# Patient Record
Sex: Female | Born: 1935 | Race: White | Hispanic: No | Smoking: Never smoker
Health system: Southern US, Community
[De-identification: ages and names within clinical notes are randomized; demographics above are authoritative.]

## PROBLEM LIST (undated history)

## (undated) DIAGNOSIS — C679 Malignant neoplasm of bladder, unspecified: Secondary | ICD-10-CM

## (undated) DIAGNOSIS — G309 Alzheimer's disease, unspecified: Secondary | ICD-10-CM

## (undated) DIAGNOSIS — K219 Gastro-esophageal reflux disease without esophagitis: Secondary | ICD-10-CM

## (undated) DIAGNOSIS — K5900 Constipation, unspecified: Secondary | ICD-10-CM

## (undated) DIAGNOSIS — S32000A Wedge compression fracture of unspecified lumbar vertebra, initial encounter for closed fracture: Secondary | ICD-10-CM

## (undated) DIAGNOSIS — I2510 Atherosclerotic heart disease of native coronary artery without angina pectoris: Secondary | ICD-10-CM

## (undated) DIAGNOSIS — T4145XA Adverse effect of unspecified anesthetic, initial encounter: Secondary | ICD-10-CM

## (undated) DIAGNOSIS — E039 Hypothyroidism, unspecified: Secondary | ICD-10-CM

## (undated) DIAGNOSIS — F0280 Dementia in other diseases classified elsewhere without behavioral disturbance: Secondary | ICD-10-CM

## (undated) DIAGNOSIS — E785 Hyperlipidemia, unspecified: Secondary | ICD-10-CM

## (undated) DIAGNOSIS — I10 Essential (primary) hypertension: Secondary | ICD-10-CM

## (undated) DIAGNOSIS — S42309A Unspecified fracture of shaft of humerus, unspecified arm, initial encounter for closed fracture: Secondary | ICD-10-CM

## (undated) DIAGNOSIS — M1990 Unspecified osteoarthritis, unspecified site: Secondary | ICD-10-CM

## (undated) DIAGNOSIS — Z8719 Personal history of other diseases of the digestive system: Secondary | ICD-10-CM

## (undated) DIAGNOSIS — Z8711 Personal history of peptic ulcer disease: Secondary | ICD-10-CM

## (undated) DIAGNOSIS — T8859XA Other complications of anesthesia, initial encounter: Secondary | ICD-10-CM

## (undated) DIAGNOSIS — S72002A Fracture of unspecified part of neck of left femur, initial encounter for closed fracture: Secondary | ICD-10-CM

## (undated) DIAGNOSIS — R112 Nausea with vomiting, unspecified: Secondary | ICD-10-CM

## (undated) DIAGNOSIS — Z9889 Other specified postprocedural states: Secondary | ICD-10-CM

## (undated) HISTORY — PX: ABDOMINAL HYSTERECTOMY: SHX81

## (undated) HISTORY — PX: CATARACT EXTRACTION, BILATERAL: SHX1313

## (undated) HISTORY — PX: CARPAL TUNNEL RELEASE: SHX101

---

## 1965-09-19 HISTORY — PX: CLOSED REDUCTION RADIAL HEAD / NECK FRACTURE: SUR238

## 1993-09-19 HISTORY — PX: CORONARY ARTERY BYPASS GRAFT: SHX141

## 1997-12-18 ENCOUNTER — Encounter: Admission: RE | Admit: 1997-12-18 | Discharge: 1998-03-18 | Payer: Self-pay | Admitting: Neurosurgery

## 1998-03-09 ENCOUNTER — Encounter: Admission: RE | Admit: 1998-03-09 | Discharge: 1998-06-07 | Payer: Self-pay | Admitting: Neurosurgery

## 1998-05-01 ENCOUNTER — Inpatient Hospital Stay (HOSPITAL_COMMUNITY): Admission: EM | Admit: 1998-05-01 | Discharge: 1998-05-11 | Payer: Self-pay | Admitting: Emergency Medicine

## 1999-04-06 ENCOUNTER — Ambulatory Visit (HOSPITAL_COMMUNITY): Admission: RE | Admit: 1999-04-06 | Discharge: 1999-04-06 | Payer: Self-pay | Admitting: Interventional Cardiology

## 1999-04-20 ENCOUNTER — Encounter: Payer: Self-pay | Admitting: Cardiothoracic Surgery

## 1999-04-20 ENCOUNTER — Inpatient Hospital Stay (HOSPITAL_COMMUNITY): Admission: RE | Admit: 1999-04-20 | Discharge: 1999-04-26 | Payer: Self-pay | Admitting: Cardiothoracic Surgery

## 1999-04-21 ENCOUNTER — Encounter: Payer: Self-pay | Admitting: Cardiothoracic Surgery

## 1999-04-22 ENCOUNTER — Encounter: Payer: Self-pay | Admitting: Cardiothoracic Surgery

## 1999-04-23 ENCOUNTER — Encounter: Payer: Self-pay | Admitting: Cardiothoracic Surgery

## 1999-05-18 ENCOUNTER — Encounter: Payer: Self-pay | Admitting: Cardiothoracic Surgery

## 1999-05-18 ENCOUNTER — Emergency Department (HOSPITAL_COMMUNITY): Admission: EM | Admit: 1999-05-18 | Discharge: 1999-05-18 | Payer: Self-pay | Admitting: Emergency Medicine

## 1999-06-01 ENCOUNTER — Encounter (HOSPITAL_COMMUNITY): Admission: RE | Admit: 1999-06-01 | Discharge: 1999-08-30 | Payer: Self-pay

## 1999-06-29 ENCOUNTER — Encounter: Admission: RE | Admit: 1999-06-29 | Discharge: 1999-06-29 | Payer: Self-pay | Admitting: Neurosurgery

## 1999-09-28 ENCOUNTER — Encounter: Payer: Self-pay | Admitting: Internal Medicine

## 1999-09-28 ENCOUNTER — Encounter: Admission: RE | Admit: 1999-09-28 | Discharge: 1999-09-28 | Payer: Self-pay | Admitting: Internal Medicine

## 2000-10-02 ENCOUNTER — Encounter: Admission: RE | Admit: 2000-10-02 | Discharge: 2000-10-02 | Payer: Self-pay | Admitting: Internal Medicine

## 2000-10-02 ENCOUNTER — Encounter: Payer: Self-pay | Admitting: Internal Medicine

## 2001-10-04 ENCOUNTER — Encounter: Payer: Self-pay | Admitting: Internal Medicine

## 2001-10-04 ENCOUNTER — Encounter: Admission: RE | Admit: 2001-10-04 | Discharge: 2001-10-04 | Payer: Self-pay | Admitting: Internal Medicine

## 2002-10-07 ENCOUNTER — Encounter: Payer: Self-pay | Admitting: Internal Medicine

## 2002-10-07 ENCOUNTER — Encounter: Admission: RE | Admit: 2002-10-07 | Discharge: 2002-10-07 | Payer: Self-pay | Admitting: Internal Medicine

## 2003-10-30 ENCOUNTER — Encounter: Admission: RE | Admit: 2003-10-30 | Discharge: 2003-10-30 | Payer: Self-pay | Admitting: Internal Medicine

## 2004-07-21 ENCOUNTER — Inpatient Hospital Stay (HOSPITAL_COMMUNITY): Admission: EM | Admit: 2004-07-21 | Discharge: 2004-07-22 | Payer: Self-pay | Admitting: Emergency Medicine

## 2004-10-20 HISTORY — PX: TOTAL HIP ARTHROPLASTY: SHX124

## 2004-11-08 ENCOUNTER — Inpatient Hospital Stay (HOSPITAL_COMMUNITY): Admission: RE | Admit: 2004-11-08 | Discharge: 2004-11-11 | Payer: Self-pay | Admitting: Orthopedic Surgery

## 2005-01-17 ENCOUNTER — Encounter: Admission: RE | Admit: 2005-01-17 | Discharge: 2005-01-17 | Payer: Self-pay | Admitting: Internal Medicine

## 2006-07-12 ENCOUNTER — Encounter: Admission: RE | Admit: 2006-07-12 | Discharge: 2006-07-12 | Payer: Self-pay | Admitting: Internal Medicine

## 2008-04-28 ENCOUNTER — Encounter: Admission: RE | Admit: 2008-04-28 | Discharge: 2008-04-28 | Payer: Self-pay | Admitting: Internal Medicine

## 2009-01-05 ENCOUNTER — Encounter: Admission: RE | Admit: 2009-01-05 | Discharge: 2009-01-05 | Payer: Self-pay | Admitting: Internal Medicine

## 2009-01-17 HISTORY — PX: CYSTOSCOPY: SUR368

## 2009-01-23 ENCOUNTER — Encounter: Admission: RE | Admit: 2009-01-23 | Discharge: 2009-01-23 | Payer: Self-pay | Admitting: Urology

## 2009-01-28 ENCOUNTER — Ambulatory Visit (HOSPITAL_BASED_OUTPATIENT_CLINIC_OR_DEPARTMENT_OTHER): Admission: RE | Admit: 2009-01-28 | Discharge: 2009-01-28 | Payer: Self-pay | Admitting: Urology

## 2009-01-28 ENCOUNTER — Encounter (INDEPENDENT_AMBULATORY_CARE_PROVIDER_SITE_OTHER): Payer: Self-pay | Admitting: Urology

## 2009-08-07 ENCOUNTER — Encounter: Admission: RE | Admit: 2009-08-07 | Discharge: 2009-08-07 | Payer: Self-pay | Admitting: Geriatric Medicine

## 2009-08-19 HISTORY — PX: TRANSURETHRAL RESECTION OF BLADDER TUMOR: SHX2575

## 2009-09-01 ENCOUNTER — Ambulatory Visit (HOSPITAL_BASED_OUTPATIENT_CLINIC_OR_DEPARTMENT_OTHER): Admission: RE | Admit: 2009-09-01 | Discharge: 2009-09-01 | Payer: Self-pay | Admitting: Urology

## 2010-05-22 ENCOUNTER — Emergency Department (HOSPITAL_COMMUNITY): Admission: EM | Admit: 2010-05-22 | Discharge: 2010-05-22 | Payer: Self-pay | Admitting: Family Medicine

## 2010-10-10 ENCOUNTER — Encounter: Payer: Self-pay | Admitting: Orthopedic Surgery

## 2010-11-12 ENCOUNTER — Emergency Department (HOSPITAL_COMMUNITY): Payer: Medicare Other

## 2010-11-12 ENCOUNTER — Encounter (HOSPITAL_COMMUNITY): Payer: Self-pay

## 2010-11-12 ENCOUNTER — Inpatient Hospital Stay (HOSPITAL_COMMUNITY)
Admission: EM | Admit: 2010-11-12 | Discharge: 2010-11-15 | DRG: 552 | Disposition: A | Discharge status: Home Health | Payer: Medicare Other | Attending: Internal Medicine | Admitting: Internal Medicine

## 2010-11-12 DIAGNOSIS — S32009A Unspecified fracture of unspecified lumbar vertebra, initial encounter for closed fracture: Principal | ICD-10-CM | POA: Diagnosis present

## 2010-11-12 DIAGNOSIS — E039 Hypothyroidism, unspecified: Secondary | ICD-10-CM | POA: Diagnosis present

## 2010-11-12 DIAGNOSIS — W06XXXA Fall from bed, initial encounter: Secondary | ICD-10-CM | POA: Diagnosis present

## 2010-11-12 DIAGNOSIS — S0100XA Unspecified open wound of scalp, initial encounter: Secondary | ICD-10-CM | POA: Diagnosis present

## 2010-11-12 DIAGNOSIS — D510 Vitamin B12 deficiency anemia due to intrinsic factor deficiency: Secondary | ICD-10-CM | POA: Diagnosis present

## 2010-11-12 DIAGNOSIS — Y92009 Unspecified place in unspecified non-institutional (private) residence as the place of occurrence of the external cause: Secondary | ICD-10-CM

## 2010-11-12 DIAGNOSIS — I10 Essential (primary) hypertension: Secondary | ICD-10-CM | POA: Diagnosis present

## 2010-11-12 DIAGNOSIS — E876 Hypokalemia: Secondary | ICD-10-CM | POA: Diagnosis present

## 2010-11-12 DIAGNOSIS — C679 Malignant neoplasm of bladder, unspecified: Secondary | ICD-10-CM | POA: Diagnosis present

## 2010-11-12 DIAGNOSIS — Z951 Presence of aortocoronary bypass graft: Secondary | ICD-10-CM

## 2010-11-12 DIAGNOSIS — F068 Other specified mental disorders due to known physiological condition: Secondary | ICD-10-CM | POA: Diagnosis present

## 2010-11-12 LAB — POCT I-STAT, CHEM 8
BUN: 10 mg/dL (ref 6–23)
Calcium, Ion: 1.09 mmol/L — ABNORMAL LOW (ref 1.12–1.32)
Chloride: 97 mEq/L (ref 96–112)
Creatinine, Ser: 0.9 mg/dL (ref 0.4–1.2)
Glucose, Bld: 168 mg/dL — ABNORMAL HIGH (ref 70–99)
HCT: 36 % (ref 36.0–46.0)
Hemoglobin: 12.2 g/dL (ref 12.0–15.0)
Potassium: 2.8 mEq/L — ABNORMAL LOW (ref 3.5–5.1)
Sodium: 133 mEq/L — ABNORMAL LOW (ref 135–145)
TCO2: 25 mmol/L (ref 0–100)

## 2010-11-12 LAB — CK TOTAL AND CKMB (NOT AT ARMC)
CK, MB: 3.9 ng/mL (ref 0.3–4.0)
Relative Index: 4.2 — ABNORMAL HIGH (ref 0.0–2.5)
Total CK: 120 U/L (ref 7–177)

## 2010-11-12 LAB — BASIC METABOLIC PANEL
CO2: 28 mEq/L (ref 19–32)
Calcium: 8.3 mg/dL — ABNORMAL LOW (ref 8.4–10.5)
Creatinine, Ser: 0.78 mg/dL (ref 0.4–1.2)
Glucose, Bld: 106 mg/dL — ABNORMAL HIGH (ref 70–99)
Sodium: 131 mEq/L — ABNORMAL LOW (ref 135–145)

## 2010-11-12 LAB — CBC
HCT: 35.1 % — ABNORMAL LOW (ref 36.0–46.0)
Hemoglobin: 12.2 g/dL (ref 12.0–15.0)
MCH: 32 pg (ref 26.0–34.0)
MCHC: 34.8 g/dL (ref 30.0–36.0)
MCV: 92.1 fL (ref 78.0–100.0)
Platelets: 234 10*3/uL (ref 150–400)
RBC: 3.81 MIL/uL — ABNORMAL LOW (ref 3.87–5.11)
RDW: 12.5 % (ref 11.5–15.5)
WBC: 15.2 10*3/uL — ABNORMAL HIGH (ref 4.0–10.5)

## 2010-11-12 LAB — URINALYSIS, ROUTINE W REFLEX MICROSCOPIC
Hgb urine dipstick: NEGATIVE
Protein, ur: NEGATIVE mg/dL
Urine Glucose, Fasting: NEGATIVE mg/dL
Urobilinogen, UA: 1 mg/dL (ref 0.0–1.0)
pH: 6 (ref 5.0–8.0)

## 2010-11-12 LAB — DIFFERENTIAL
Basophils Absolute: 0 10*3/uL (ref 0.0–0.1)
Eosinophils Relative: 2 % (ref 0–5)
Lymphocytes Relative: 15 % (ref 12–46)
Lymphs Abs: 2.3 10*3/uL (ref 0.7–4.0)
Monocytes Relative: 7 % (ref 3–12)
Neutro Abs: 11.5 10*3/uL — ABNORMAL HIGH (ref 1.7–7.7)
Neutrophils Relative %: 76 % (ref 43–77)

## 2010-11-12 LAB — TROPONIN I: Troponin I: 0.11 ng/mL — ABNORMAL HIGH (ref 0.00–0.06)

## 2010-11-13 LAB — URINE CULTURE
Colony Count: NO GROWTH
Culture: NO GROWTH

## 2010-11-14 LAB — BASIC METABOLIC PANEL
BUN: 6 mg/dL (ref 6–23)
Calcium: 9 mg/dL (ref 8.4–10.5)
Creatinine, Ser: 0.63 mg/dL (ref 0.4–1.2)
GFR calc non Af Amer: >60 mL/min (ref >60)
Potassium: 4.1 mEq/L (ref 3.5–5.1)

## 2010-11-20 NOTE — Discharge Summary (Signed)
  NAME:  Heather [name], TRAPANI NO.:  [id number]  MEDICAL RECORD NO.:  [id number]           PATIENT TYPE:  I  LOCATION:  1602                         FACILITY:  [facility]  PHYSICIAN:  Theressa Millard, M.D.    DATE OF BIRTH:  10-25-1918  DATE OF ADMISSION:  11/12/2010 DATE OF DISCHARGE:  11/15/2010                              DISCHARGE SUMMARY   ADMITTING DIAGNOSES: 1. Fall with compression fracture and head laceration. 2. 25% L2 compression fracture. 3. Superficial laceration of the scalp. 4. Dementia. 5. Hypertension. 6. Hypothyroidism. 7. Pernicious anemia. 8. Papillary bladder cancer.  The patient is a 77 year old white female who lives pretty independently but has family members checking on her regularly.  She suffered a fall and came to the emergency department with back pain and small laceration of the scalp.  HOSPITAL COURSE:  After the scalp laceration was sutured, she underwent further evaluation which included a CT scan of the head without contrast that did not show any fracture, nor any evidence of bleeding.  She had spine films done which revealed evidence of compression fractures.  So, a CT scan of the thoracic spine was done.  This showed an old L1 compression fracture and a new endplate compression fracture of L2.  She had a fair amount of pain on the first hospital day requiring intravenous pain medications.  About her second hospital day, she was only taking Tylenol, was able to ambulate with assistance of physical therapy for 115 feet and on the day of discharge was ambulatory and feeling much better.  In regard to her memory loss, she had a little bit of confusion but actually did pretty well.  She recognized me when I came in to see her for her visit on the day of discharge.  Her blood pressure was slightly high during the hospitalization and this will be followed up on as an outpatient.  She will come in 5 days to have her staples  removed.  DISCHARGE MEDICATIONS: 1. Acetaminophen 500 mg 1 to 2 capsules q.6 h p.r.n. 2. Calcium with vitamin D twice daily. 3. Vitamin B12 shot monthly. 4. Metoprolol tartrate 25 mg twice daily. 5. Ranitidine 150 mg twice daily. 6. Synthroid 25 mcg daily. 7. Zetia 10 mg daily. 8. Plavix 75 mg daily. 9. Hydrochlorothiazide 25 mg daily.  FOLLOWUP:  See above.  In addition, she has an appointment to see me in May, to keep that appointment.  I discussed this situation with her son at length.  He is agreeable to the plan, notes that she is getting a little worse in terms of memory loss and we discussed the fact that she would need placement at some point but not at the present time.     Theressa Millard, M.D.     JO/MEDQ  D:  11/15/2010  T:  11/15/2010  Job:  [id number]  Electronically Signed by Theressa Millard M.D. on 11/20/2010 05:02:13 PM

## 2010-12-03 NOTE — H&P (Signed)
NAME:  Heather, [name] NO.:  [id number]  MEDICAL RECORD NO.:  [id number]           PATIENT TYPE:  E  LOCATION:  WLED                         FACILITY:  [facility]  PHYSICIAN:  Houston Siren, MD           DATE OF BIRTH:  02/02/1919  DATE OF ADMISSION:  11/12/2010 DATE OF DISCHARGE:                             HISTORY & PHYSICAL   PRIMARY CARE PHYSICIAN:  Dr. Theressa Millard.  ADVANCE DIRECTIVE:  Full code.  REASON FOR ADMISSION:  Compressive fracture of the lumbar spine, unable to ambulate.  HISTORY OF PRESENT ILLNESS:  This is a 77 year old female with history of dementia living alone, history of coronary artery disease status post coronary artery bypass graft in 1999, history of diverticulosis, hypothyroidism, GERD, and prior cervical fusion reportedly fell out of her bed today and hurt her back.  She denied any loss of consciousness, but is complaining of severe low-back pain.  She has no chest pain or shortness of breath.  She denies any headache or any visual problems. Workup in the emergency room included comprehensive metabolic panel which showed potassium of 2.8, blood sugar of 168, and creatinine of 0.9.  She has a negative urinalysis and negative head CT except for except for right maxillary sinusitis.  She does have elevated white count of 15,000 and hemoglobin of 12.2.  Her thoracic spine shows remote L1 fracture.  Her chest x-ray is negative except for cardiomegaly.  Her pelvic x-ray shows status post right total hip arthroplasty.  Her pelvic CT showed an anterior superior endplate fracture of L2 with at least 20% loss of height and moderate central canal stenosis at L4-L5. Hospitalist was asked to admit the patient to treat her symptoms as she has severe back pain, lives alone and cannot ambulate.  PAST MEDICAL HISTORY: 1. Coronary artery disease. 2. Dementia. 3. Hypertension. 4. Hypothyroidism. 5. GERD. 6. Diverticulosis. 7. Coronary artery disease  status post bypass graft in 1999. 8. Cervical fusion.  SOCIAL HISTORY:  She lives alone.  Denies alcohol, tobacco, or drug use.  FAMILY HISTORY:  Noncontributory.  ALLERGIES:  No known drug allergies.  CURRENT MEDICATIONS: 1. Plavix 75 mg per day. 2. HCTZ 25 mg per day. 3. Metoprolol 25 mg b.i.d. 4. Synthroid 25 mcg per day. 5. Zetia 10 mg per day. 6. Zantac 75 mg per day.  REVIEW OF SYSTEMS:  Otherwise unremarkable.  PHYSICAL EXAMINATION:  VITAL SIGNS:  Shows blood pressure 180/100, pulse of 80, respiratory rate of 20, and temperature 97.9. GENERAL:  She is alert and answers questions.  Some answers appropriate. Other answers they note that she is confused. HEENT:  She missed the year and the month, but now she is in the hospital.  She can recall that she fell.  Her speech is fluent and she has facial symmetry.  Her tongue is midline.  Uvula elevated with phonation. NECK:  Supple.  There is no carotid bruit. CARDIAC EXAM:  Revealed S1 and S2 with some premature beats.  No murmur, rub, or gallop. LUNGS:  Clear with good inspiratory and expiratory efforts. ABDOMINAL EXAM:  Soft, nondistended, and nontender.  No palpable mass. EXTREMITIES:  Showed no edema.  No calf tenderness. SKIN:  Warm and dry. NEUROLOGICAL EXAM:  Unremarkable. PSYCHIATRIC EXAM:  Unremarkable.  OBJECTIVE FINDINGS:  Shows serum sodium 133, potassium 2.8, glucose 168, and creatinine 0.9.  Urinalysis is negative.  INR 1.12.  White count of 15.2000, hemoglobin 12.2, and platelet count 234,000.  Head CT showed right maxillary sinus.  The CT of her pelvic shows acute fracture with at least 25% loss of height of L2 and moderate central canal stenosis at the L4 and L5.  IMPRESSION:  This is a 77 year old female who lives alone, fell, and fractured her L2 vertebral column with at least 25% loss of height  needing pain control as she cannot ambulate due to the pain.  Consider orthopedic consult to see whether  or not vertebroplasty and kyphoplasty or similar procedure can be done for her pain, and if that is the case please note that she is on Plavix.  We will continue all her medications.  Suspect that she will need short-term rehab.  We will replete her potassium with 4 runs IV, along with some added to her IV fluids and p.o. as well daily.  She is a full code and will be admitted to [facility].  She is stable.     Houston Siren, MD     PL/MEDQ  D:  11/12/2010  T:  11/12/2010  Job:  [id number]  Electronically Signed by Houston Siren  on 12/03/2010 08:13:09 PM

## 2010-12-21 LAB — POCT I-STAT 4, (NA,K, GLUC, HGB,HCT)
HCT: 38 % (ref 36.0–46.0)
Sodium: 141 mEq/L (ref 135–145)

## 2010-12-28 LAB — BASIC METABOLIC PANEL
BUN: 15 mg/dL (ref 6–23)
CO2: 28 mEq/L (ref 19–32)
Chloride: 102 mEq/L (ref 96–112)
Glucose, Bld: 98 mg/dL (ref 70–99)
Potassium: 3.6 mEq/L (ref 3.5–5.1)

## 2011-02-01 NOTE — Op Note (Signed)
NAME:  Heather [name], Heather [name]                ACCOUNT NO.:  [id number]   MEDICAL RECORD NO.:  [id number]          PATIENT TYPE:  AMB   LOCATION:  NESC                         FACILITY:  [facility]   PHYSICIAN:  Maretta Bees. Vonita Moss, M.D.DATE OF BIRTH:  09-Jun-1919   DATE OF PROCEDURE:  01/28/2009  DATE OF DISCHARGE:                               OPERATIVE REPORT   PREOPERATIVE DIAGNOSIS:  Carcinoma of the bladder.   POSTOPERATIVE DIAGNOSIS:  Carcinoma of the bladder.   PROCEDURE:  Cystoscopy, cold cup bladder biopsy and bilateral retrograde  pyelograms with interpretation.   SURGEON:  Dr. Larey Dresser.   ANESTHESIA:  MAC.   INDICATIONS:  This 77 year old lady had 3-4 days of gross hematuria.  She was taking Plavix.  She underwent cystoscopy in the office and she  had a small papillary tumor on what was thought to be the bladder floor  with a tiny blood clot.  She is been off Plavix and cleared for surgery  by Dr. Katrinka Blazing and brought to the OR today.  I should also add that a  renal ultrasound on the outside was reported as normal.   PROCEDURE:  The patient brought to the operating room and placed in  lithotomy position.  External genitalia prepped and draped in usual  fashion.  She was cystoscoped.  There was no active bleeding.  There was  an erythematous area between the dome and the base where I believe this  tumor used to be.  It appears as if it has sloughed off as there was no  evident papillary tumor present today as there was on office cystoscopy.   Using the cystoscope to insert a cone-tipped ureteral catheter, I  performed bilateral retrograde pyelograms that showed delicate  nonobstructed ureters and pyelocaliceal systems with no filling defects.  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   I then used the cold cup bladder biopsy forceps to biopsy the  erythematous area in the lower part of the bladder dome.  The biopsy  sites were fulgurated with the Bugbee  electrode.  The bladder was  emptied, the scope removed.  The patient sent to recovery room in good  condition having tolerated the procedure well.  She had essentially no  blood loss.      Maretta Bees. Vonita Moss, M.D.  Electronically Signed    LJP/MEDQ  D:  01/28/2009  T:  01/28/2009  Job:  [id number]   cc:   Lyn Records, M.D.  Fax: [phone]

## 2011-02-04 NOTE — Discharge Summary (Signed)
NAME:  Heather [name], Heather [name] NO.:  [id number]   MEDICAL RECORD NO.:  [id number]          PATIENT TYPE:  INP   LOCATION:  2034                         FACILITY:  [facility]   PHYSICIAN:  Lyn Records, M.D.   DATE OF BIRTH:  Sep 17, 1919   DATE OF ADMISSION:  07/21/2004  DATE OF DISCHARGE:  07/22/2004                                 DISCHARGE SUMMARY   PRIMARY CARE PHYSICIAN:  Theressa Millard, M.D.   CHIEF COMPLAINT AND REASON FOR ADMISSION:  Ms. Heather [name] is an 77-year-  old female patient with history of CABG x3 in 1999, reported to the ER on  the day of admission with intermittent waxing and waning, nocturnal chest  pain which began worsening about one year prior.  She had been using  increasing amounts of sublingual nitroglycerin and had noticed increased  frequency and chest discomfort.  Her chest pain was only mildly responsive  to nitroglycerin. Therefore, the patient, because she began having  increasing chest pain, called EMS and presented to the ER.  She was treated  en route with O2, sublingual nitroglycerin and aspirin and chest pain had  become relieved by the time she arrived to the ER. Important to note that  before her CABG she had not had any chest pain complaint and her cardiac  equivalent symptoms were shortness of breath, fatigue, and episodic pale  discoloration.  It is also important to note that the patient has been  tolerating work out in her yard, raking leaves for the past several weeks  and over the past weekend and did not have any chest discomfort with this  significant exertion, although the patient feels that this exertion may be  the etiology of her chest pain; i.e. musculoskeletal etiology.  Please refer  to the initial dictated history and physical examination for further  specifics regarding this admission.   ADMISSION DIAGNOSES:  1.  Recurrent chest pain in a patient with known coronary artery disease      rule out unstable angina.  2.   Dyslipidemia.  3.  Osteoarthritis.   HOSPITAL COURSE:  RECURRENT CHEST PAIN:  The patient was admitted to the  telemetry unit.  Serial cardiac isoenzymes were checked and were negative.  Repeat EKG was done and showed no changes.  As a precaution, IV heparin was  initiated.  Plavix was continued and low dose aspirin was initiated in  conjunction with proton pump inhibitor since the patient has significant  issues with heartburn.  Her Toprol was also continued as well as  hydrochlorothiazide.  The patient underwent an adenosine Cardiolite study on  July 22, 2004, which did not demonstrate any ischemia.  The patient had  no further chest pain.  She was reevaluated by Dr. Katrinka Blazing post Cardiolite and  at this point he felt that again maybe her discomfort was more  musculoskeletal in nature especially since she was now having more focal  pain in the neck and arms as well as anterior chest.  He as suspicious for  cervical disc problem.  At this point, she was cleared for discharge  home.   FINAL DIAGNOSES:  1.  Noncardiac chest discomfort.  2.  Neck and arm discomfort with associated chest discomfort.  Rule out      musculoskeletal versus other cervical disc type disorder.  3.  Hypertension, controlled.  4.  Dyslipidemia.   DISCHARGE MEDICATIONS:  1.  Toprol XL 25 daily.  2.  Zetia 10 mg daily.  3.  Plavix 75 mg daily.  4.  Hydrochlorothiazide 25 mg daily.  5.  Nitroglycerin 0.4 under the tongue p.r.n. pain.  6.  Vitamin D and vitamin E, calcium, Miacalcin as previous.   ACTIVITY:  As tolerated.   DIET:  Low fat, low salt.   FOLLOW UP:  1.  She needs to schedule an appointment with Dr. Earl Gala to have the neck      and arm and chest pain reevaluated from an internal medicine      prospective.  2.  She is to call Dr. Michaelle Copas office as needed, otherwise keep any annual      or semiannual appointments previously scheduled.       ALE/MEDQ  D:  08/19/2004  T:  08/19/2004  Job:   [id number]   cc:   Theressa Millard, M.D.  [address] [place]  [place] [address]  Fax: [phone]

## 2011-02-04 NOTE — H&P (Signed)
NAME:  Heather [name], KOCH NO.:  [id number]   MEDICAL RECORD NO.:  [id number]          PATIENT TYPE:  EMS   LOCATION:  MAJO                         FACILITY:  [facility]   PHYSICIAN:  Lyn Records, M.D.   DATE OF BIRTH:  1919/05/02   DATE OF ADMISSION:  07/21/2004  DATE OF DISCHARGE:                                HISTORY & PHYSICAL   CHIEF COMPLAINT:  Recurrent chest pain.   HISTORY OF PRESENT ILLNESS:  Heather [name] is an 77 year old female patient of  Dr. Michaelle Copas with history of CABG x3 in 1999.  I am unable to locate  operative reports at this time to clarify which vessels.  She presents here  today in the ER relating a history of intermittent waxing and waning,  nocturnal chest pain that started up about one year ago.  The patient began  using increasing amounts of sublingual nitroglycerin because of this.  Over  the past several weeks, she has had more frequent episodes of this chest  discomfort but not very severe.  Therefore, the patient was able to drift  off to sleep after taking more nitroglycerin and prior to this full relief  of chest pain.  Last night she had significant severe chest pain that  awakened her from sleep and instead of being waxing and waning in quality,  was continuous.  Chest pain persisted initially despite nitroglycerin and  since she was having symptoms this morning, she presented to the ER. By the  time she got to the ER she was having mild chest discomfort.  They gave her  sublingual nitroglycerin, O2 and aspirin and her chest pain is now relieved.  All episodes of chest pain have had no associated symptoms such as nausea,  diaphoresis, pale, no palpitations, etc.  The patient describes the pain as  being located in the left anterior chest radiating through to the back and  then down through the left arm and associated left hand numbness and  tingling.  In regard to previous cardiac symptoms before her CABG she had  not had any evidence of  chest pain but was complaining of shortness of  breath, fatigue and episodic pale discoloration.  Important to note that the  patient has been working out in her yard raking leaves for the past several  weeks and did this same type of physical activity over the weekend.  She has  not tried any analgesics for this nocturnal chest pressure but again relates  that it has been relieved by nitroglycerin.   ALLERGIES:  1.  CODEINE which causes nausea.  2.  ASPIRIN which causes heartburn, not a true allergy.   MEDICATIONS:  1.  Zetia 10 mg daily.  2.  Toprol XL, the patient is unclear about dose, once daily.  3.  Extra strength Tylenol at hour of sleep.  4.  Plavix 75 mg daily.  5.  Hydrochlorothiazide one whole 25 mg tablet daily.   REVIEW OF SYMPTOMS:  Essentially noncontributory.  No fevers, chills,  myalgias, no cough, no orthopnea, no dyspnea on exertion, no abdominal pain,  no  reflux, no palpitations, no dizziness, no syncope, no dark or bloody  stools, no leg pain other than chronic right hip pain for which she sees a  physician for, no lower extremity swelling.   FAMILY HISTORY:  She had three sisters all of whom died of myocardial  infarction related problems.  Father died of CVA, mother died of cerebral  hemorrhage.   SOCIAL HISTORY:  The patient has never utilized tobacco or alcohol products.  She still drives.  She lives alone.  Her family is nearby.   PAST MEDICAL HISTORY:  According to the patient:  1.  CABG x3 in 1999.  2.  Dyslipidemia.  3.  Osteoarthritis with continued problems with pain in the right hip.   PHYSICAL EXAMINATION:  GENERAL APPEARANCE:  A pleasant, well-developed,  elderly female.  Son is in room.  Appropriate to all questions asked.  Currently chest pain-free.  VITAL SIGNS:  Initial blood pressure was 161/74, pulse rate 68, after  treatment down to 100/71 with heart rate of 55, respiratory rate 20,  temperature 97.  HEENT:  Head is normocephalic.   No adenopathy.  Oral mucous membranes pink  and moist.  CHEST:  Bilateral lung sounds are clear to auscultation posteriorly.  Respiratory effort is nonlabored.  She is currently on nasal cannula O2.  Anterior chest wall is quite tender to palpation bilaterally.  CARDIOVASCULAR:  Heart sounds are S1 and S2, no JVD.  No carotid bruits.  Carotids are 2+ bilaterally.  __________ pulse is regular.  She is in sinus  rhythm, sinus bradycardia on the telemetry monitor.  ABDOMEN:  Soft, nontender, nondistended, no obvious hepatosplenomegaly,  masses or bruits.  EXTREMITIES:  Symmetrical __________, without clubbing, cyanosis, or edema.  Pulses are palpable at 2+ bilaterally radial, femoral and pedal.  NEUROLOGIC:  The patient is alert and oriented x3, moving all extremities  x4.  No focal deficits.   LABORATORY DATA:  Point of care enzymes:  1.  Myoglobin 152, MB 6.2,  troponin less than 0.05.  2. Myoglobin 121, MB 4.3, troponin less than 0.05.  BNP is 77.6.   Sodium 138, potassium 3.7, chloride 102, CO2 29, glucose 100, BUN 7,  creatinine 0.8.  LFT are within normal limits.  CBC shows a white count of  5700, hemoglobin 12.4, hematocrit 36.1, blood pressure 285,000.  PTT 33, PT  12.8, INR 1.0.   DIAGNOSTICS:  Chest x-ray was done in the ER, shows no acute process.   EKG done in the ER shows sinus rhythm, normal axis, normal R-wave rotation.  No acute ischemic changes.  This is actually improved as compared to the EKG  from 2003, which demonstrated some subtle T-wave inversion in leads III.   IMPRESSION:  1.  Recurrent chest pain in patient with known coronary artery disease      suspected unstable angina.  2.  Dyslipidemia.  3.  Osteoarthritis with chronic right hip pain.   PLAN:  1.  Dr. Katrinka Blazing will admit this patient to the telemetry unit.  2.  Will begin serial cardiac isoenzymes and repeat EKG in the morning. 3.  We will begin IV heparin and continue Plavix and add low dose aspirin       with PPI since patient has significant heartburn.  4.  Will continue her Toprol at her home dose.  Will start at 25 mg daily      with hold parameters since her blood pressure is now 100 systolic.  We  will also continue her hydrochlorothiazide since sodium and potassium      are normal.  5.  Will pursue an adenosine Cardiolite study and once enzymes become      positive or based on patient's past cardiac history and symptoms that      are consistent with unstable angina, Dr. Katrinka Blazing may wish to defer      noninvasive testing and proceed directly with diagnostic coronary      angiography.  6.  Will continue her Zetia since LFTs are normal and check a fasting lipid      panel in the morning.  7.  Will continue her Extra Strength Tylenol at night for pain.       ALE/MEDQ  D:  07/21/2004  T:  07/21/2004  Job:  [id number]   cc:   Theressa Millard, M.D.  [address] [place]  [place] [address]  Fax: [phone]   Lyn Records III, M.D.  [address]  [place]  [place] [address]  Fax: [phone]

## 2011-02-04 NOTE — Discharge Summary (Signed)
NAMESHADOE, CRYAN NO.:  [id number]   MEDICAL RECORD NO.:  [id number]          PATIENT TYPE:  INP   LOCATION:  5018                         FACILITY:  [facility]   PHYSICIAN:  Mila Homer. Sherlean Foot, M.D. DATE OF BIRTH:  05-01-1919   DATE OF ADMISSION:  11/08/2004  DATE OF DISCHARGE:  11/11/2004                                 DISCHARGE SUMMARY   ADMISSION DIAGNOSIS:  1.  End stage osteoarthritis of the right hip.  2.  Arthritic changes bilateral knees.  3.  Coronary artery disease with coronary artery bypass grafting in 1999,      normal stress test November 2005.  4.  Diverticulosis.  5.  Hypothyroidism.  6.  Gastroesophageal reflux disease.  7.  Recent diagnosis of vitamin B12 anemia.   DISCHARGE DIAGNOSIS:  1.  Status post right total hip arthroplasty.  2.  Vitamin B12 anemia exacerbated by surgery requiring 2 units packed red      blood cells.  3.  Coronary artery disease with coronary artery bypass grafting in 1999,      normal stress test November 2005.  4.  Diverticulosis.  5.  Arthritis changes of the bilateral knees.  6.  Hypothyroidism.  7.  Gastroesophageal reflux disease.   HISTORY OF PRESENT ILLNESS:  Heather [name] is an 77 year old female with a  history of progressively worsening right hip pain in the last year.  The  patient states the pain in the right hip occurs with any attempts of range  of motion.  The patient has noticed loss of range of motion of the hip due  to pain and mechanical symptoms.  The pain is located over the lateral  aspect of the thigh down into the mid femur and also lower back.  She  describes an occasional shooting pain all the way down into the right knee.  The pain is described as a constant ache with occasional shooting pain.  She  reports being unable to stand for more than 30-40 minutes at a time due to  increased pain.  The patient does have chronic popping of the right hip.  She has significant waking pain.  She uses a  cane to ambulate.  X-rays of  the right hip show bone on bone.   ALLERGIES:  Codeine causes increased confusion.   CURRENT MEDICATIONS:  Synthroid 25 mcg p.o. daily, Toprol XL 25 mg p.o.  daily, Zetia 10 mg p.o. daily, Plavix 75 mg p.o. daily, hydrochlorothiazide  25 mg p.o. daily, Miacalcin nasal spray 1 spray in the ear daily, vitamin E  400 international units, vitamin D 400 mg p.o. daily, Zantac 150 mg p.o.  p.r.n.   SURGICAL PROCEDURE:  The patient was taken to the operating room on November 08, 2004, by Dr. Georgena Spurling, assisted by Arnoldo Morale, P.A.-C.  The  patient was placed under general anesthesia and underwent right total hip  arthroplasty.  The following components were used, 50 mm no hole femoral cup  with a 32 mm liner, 12 mm femoral stem with a 32 mm ball.  The patient  tolerated  the procedure well and returned to the recovery room in stable  condition.   CONSULTATIONS:  PT, OT, case management.   HOSPITAL COURSE:  On postop day one, the patient developed acute blood loss  anemia secondary to surgery, H&H 9.1 and 26.3, the patient was asymptomatic.  The patient was afebrile, vital signs stable.  The patient did have numbness  in the left hand probably secondary to positioning in the OR.  Postop day  two, the patient's H&H 7.9 and 23, the patient was transfused 2 units packed  red blood cells.  The patient, otherwise, was afebrile, vital signs stable.  Sensation in the left hand had returned to normal.  On postop day three, the  patient is afebrile, vital signs stable, H&H 10.8 and 29.6 status post 2  units packed red blood cells.  The patient was progressing well with  physical therapy and was discharged to home on postop day three in stable  condition.   CBC on admission all values were within normal limits.  Coags on admission  all values within normal limits.  Routine chemistries on admission revealed  sodium 132, potassium 3.7, chloride 94, bicarb 29, glucose  94, BUN 9,  creatinine 0.7.  Hepatic enzymes on admission were within normal limits.  Urine cultures obtained preop showed greater than 100,000, it was presumed  this was contaminate.  X-rays of the right hip, two views, obtained on  November 08, 2004, showed no apparent complications status pots recent right  total hip replacement.  On ECG dated November 02, 2004, normal sinus rhythm,  heart rate 64 beats per minute, PR interval 162 milliseconds, PRV axis 76,  24, 46.   DISCHARGE MEDICATIONS:  The patient is to resume home medications.  The  following meds were to be added, Lovenox 1 injection subcu daily x 11 days,  Percocet 5/325 1-2 tablets every 4-6 hours p.r.n. pain.   DISCHARGE INSTRUCTIONS:  Activities:  Weight bearing as tolerated with the  walker.  Home health PT/OT.  Diet:  No restrictions.  Wound care:  The  patient is to keep the wound clean and dry, may shower after two days if no  drainage.  The patient is to call the office for temperature greater than  101.5, any foul smelling drainage, or pain not controlled with pain  medications.  Follow up postop day 14 with Dr. Sherlean Foot in the office, the  patient is to call the office at [phone].   CONDITION ON DISCHARGE:  The patient was discharged to home in good, stable  condition.      GC/MEDQ  D:  01/26/2005  T:  01/26/2005  Job:  [id number]

## 2011-02-04 NOTE — H&P (Signed)
NAME:  Heather [name], SYVERSON                ACCOUNT NO.:  [id number]   MEDICAL RECORD NO.:  [id number]          PATIENT TYPE:  INP   LOCATION:                               FACILITY:  [facility]   PHYSICIAN:  Mila Homer. Sherlean Foot, M.D. DATE OF BIRTH:  19-Jul-1919   DATE OF ADMISSION:  11/08/2004  DATE OF DISCHARGE:                                HISTORY & PHYSICAL   CHIEF COMPLAINT:  Right hip pain.   HISTORY OF PRESENT ILLNESS:  The patient is an 77 year old white female with  a history of progressively worsening right hip pain over the last year.  She  states she has significant amount of pain with attempts at range of motion.  She has noted loss of range of motion of the hip due to pain and mechanical  symptoms.  The pain is located over the lateral aspect of the thigh down  into the mid femur and also up into the lower back.  She also notes it  occasionally will shoot all the way down into the knee.  She describes it as  a constant ache, but it does become a sharp shooting pain with awkward  motions.  She states she is unable to stand more than 30 to 40 minutes due  to increased pain.  She does have grinding, popping.  She does have  significant night pain.  She is currently using a cane to assist ambulation.  X-rays reveal bone-on-bone of the right hip.   ALLERGIES:  CODEINE causing increased confusion.   CURRENT MEDICATIONS:  1.  Synthroid 25 mcg p.o. daily.  2.  Toprol XL 25 mg p.o. daily.  3.  Zetia 10 mg p.o. daily.  4.  Plavix 75 mg p.o. daily.  5.  Hydrochlorothiazide 25 mg p.o. daily.  6.  Miacalcin nasal spray one spray q.naris daily.  7.  Vitamin E 400 International Units daily.  8.  Vitamin D 400 mg p.o. daily.  9.  Zantac 150 mg p.o. p.r.n.   PAST MEDICAL HISTORY:  1.  Coronary artery disease with CABG in 1999, recent stress test on      November 11 was negative for any disease.  2.  Diverticulosis.  3.  Hypothyroidism.  4.  GERD.  5.  Allergic rhinitis.   PAST SURGICAL  HISTORY:  1.  Lumpectomy left breast in 1947.  2.  Hysterectomy in 1957.  3.  Cervical fusion in 1971.  4.  CABG in 2000.  5.  The patient denies any complications of the above mentioned surgical      procedures.   SOCIAL HISTORY:  The patient is an 77 year old white female who denies any  history of smoking or alcohol use.  She is widowed and has one son who is  with her today.  She lives in a [place] house with one step to the main  entrance.  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   PRIMARY CARE PHYSICIAN:  Theressa Millard, M.D., [phone].   CARDIOLOGIST:  Lyn Records, M.D.   FAMILY HISTORY:  Mother is deceased from a stroke as  was her father.  She  has no brothers or sisters.   REVIEW OF SYSTEMS:  Positive for full upper dentures, partial lower plate.  She does wear glasses for reading.  She has been recently diagnosed with  anemia, vitamin B12 deficiency, and she has started injections.  She has  occasional problems with pain on urination and more frequently at night and  we will be checking this with preadmission urinalysis.  Otherwise all other  review of systems categories are negative and noncontributory if not  mentioned above.   PHYSICAL EXAMINATION:  VITAL SIGNS:  Height 5 foot 2 inches, weight 128  pounds, blood pressure 124/60, heart rate 64, respirations 12, and the  patient is afebrile.  GENERAL:  This is a healthy, age-appropriate, elderly white female who  ambulates very slow with deliberate steps.  She does not have a cane with  her today, but her son is assisting her in ambulation.  She easily gets on  and off the examination table without assistance.  HEENT:  Head was normocephalic.  Pupils equal, round, and reactive to light.  Extraocular movements are intact.  Sclerae is nonicteric.  External ears  were without deformities.  Gross hearing is intact. Oral buccal mucosa was  pink and moist.  NECK:  Supple, no palpable lymphadenopathy.  The patient had  significant  loss of range of motion.  She was only able to elevate it about 5 or 10  degrees above horizontal and rotate it right or left maybe 10 or 15 degrees  due to previous cervical surgery.  CHEST:  Lung sounds were clear and equal bilaterally, no wheezes, rales, or  rhonchi.  HEART:  Regular rate and rhythm with no murmurs, rubs, or gallops.  She had  a well-healed midline surgical incision.  ABDOMEN:  Soft and nontender.  Bowel sounds were normal active.  She had no  CVA region tenderness.  EXTREMITIES:  Upper extremities were symmetrically sized and shape.  She had  fairly good range of motion of her right and left shoulders.  Motor strength  is 5/5.  Lower extremities; right hip had full extension, she was only able  to flex up to about 80 degrees.  She had minimal internal and external  rotation limited by pain and mechanical blocking.  The left hip had full  extension and flexion easily up to 120 with 20 degrees of internal and  external rotation.  Both knees has very coarse crepitus on the patella with  a catching sensation with full extension, flexion back to 100 degrees.  She  was slightly sore along the mediolateral joint line.  She had no effusions.  The calves were soft and nontender.  The ankles were symmetrical with good  dorsoplantar flexion.  PERIPHEROVASCULAR:  Carotid pulses were 1+. She had no bruits.  Radial  pulses were 1+.  Dorsalis pedis pulses were 1+.  She had a lower extremity  venous stasis changes or pitting edema.  NEUROLOGY:  The patient was conscious, alert, and appropriate.  Easily  converses with the examiner.  Cranial nerves II-XII grossly intact.  She had  no gross neurologic defects noted.  BREASTS:  RECTAL:  GENITOURINARY:  Deferred at this time.   IMPRESSION:  1.  End-stage osteoarthritis right hip.  2.  Arthritic changes bilateral knees. 3.  Coronary artery disease with coronary artery bypass graft in 1999,      normal stress test in  November of 2005.  4.  Diverticulosis.  5.  Hypothyroidism.  6.  Gastroesophageal reflux disease.  7.  Recent diagnosis of vitamin B12 anemia.   PLAN:  The patient will undergo all routine labs and tests.  We will check  her urine for a possible urinary tract infection and place her on the  appropriate antibiotic if it is positive.  Otherwise, the patient will  undergo all other routine labs and tests prior to having a right total hip  arthroplasty by Dr. Sherlean Foot at [facility] on November 08, 2004.  The  patient recently has a copy of information from her primary care physician  indicating that she is low risk for this coming surgery with a negative  stress test in November of 2005.      ________________________________________  Jamelle Rushing, P.A.  ___________________________________________  Mila Homer. Sherlean Foot, M.D.    RWK/MEDQ  D:  11/02/2004  T:  11/02/2004  Job:  [id number]

## 2011-02-04 NOTE — Op Note (Signed)
NAMEVICKKI, IGOU NO.:  [id number]   MEDICAL RECORD NO.:  [id number]          PATIENT TYPE:  INP   LOCATION:  5018                         FACILITY:  [facility]   PHYSICIAN:  Mila Homer. Sherlean Foot, M.D. DATE OF BIRTH:  1919/09/09   DATE OF PROCEDURE:  11/08/2004  DATE OF DISCHARGE:                                 OPERATIVE REPORT   SURGEON:  Mila Homer. Sherlean Foot, M.D.   ASSISTANT:  Legrand Pitts. Duffy, P.A.   ANESTHESIA:  General.   PREOPERATIVE DIAGNOSIS:  Right hip osteoarthritis.   POSTOPERATIVE DIAGNOSIS:  Right hip osteoarthritis.   PROCEDURE:  Right total hip arthroplasty.   INDICATIONS FOR PROCEDURE:  The patient is an 77 year old white female with  failure of conservative measures for osteoarthritis of the hip.  Informed  consent was obtained.   DESCRIPTION OF PROCEDURE:  The patient was laid supine and administered  general anesthesia and Foley catheter placement.  She was then placed in the  left down right lateral decubitus position.  The right hip was then  sterilely prepped and draped in the usual sterile fashion.  An anterolateral  minimally invasive approach was performed.  A #10 blade was used to make a 5  cm incision.  Cautery dissection was used down to the fascia, and then the  fascia was split along the length of the incision.  The anterior leading  edge of the gluteus medius was identified, and this was retracted back with  the retractor over the top of the femoral neck, a second retractor exposing  the hip capsule under the inferior neck.  An anterior hip capsulectomy was  then performed.  At this point, the sagittal saw was used, and the template  to make our neck cut.  I also made a cut of the articular surface margin and  removed the intercalary segment in the femoral head.  This allowed me to not  dislocate the hip.  I then placed a retractor anterior and posterior to the  acetabulum.  I then removed the labrum circumferentially, and then  progressively reamed up to 48 mm, and then tamped in a 50 mm no hole, no cup  fiber mesh cup.  I then snapped in a real standard liner, accepting a 32 mm  head.  At this point, I then turned my attention to the femur.  I flexed the  leg off the bed with the knee in 90 degrees of flexion, delivering the femur  up into the wound.  I placed a Mueller retractor underneath the cut surface  of the neck.  At this point, I sequentially reamed up to 12 mm and then  broached up to the 12 broach.  I then trialed with a 0 ball.  It was  extremely stable.  I took an x-ray which showed good fit and fill and no  fractures.  At this point, I then dislocated the hip and removed the trial  components.  I then irrigated copiously and then tamped down a size 12 fully  porous coated stem and placed on a 32 mm ball into a  cleaned Morse taper.  I  then located the hip, took it through an aggressive range of motion.  It was  quite stable.  At this point, I then irrigated and closed with a running #1  Vicryl stitch, interrupted buried 0 Vicryls, subcuticular 2-0 Vicryls, and  skin staples.  I dressed with Xeroform dressing, sponges, and a sterile  Ioban drape.   COMPLICATIONS:  None.   DRAINS:  None.   ESTIMATED BLOOD LOSS:  300 mL.      SDL/MEDQ  D:  11/08/2004  T:  11/09/2004  Job:  [id number]

## 2011-06-13 ENCOUNTER — Inpatient Hospital Stay (HOSPITAL_COMMUNITY)
Admission: EM | Admit: 2011-06-13 | Discharge: 2011-06-16 | DRG: 392 | Disposition: A | Discharge status: Home / Self Care | Payer: Medicare Other | Source: Ambulatory Visit | Attending: Internal Medicine | Admitting: Internal Medicine

## 2011-06-13 DIAGNOSIS — Z79899 Other long term (current) drug therapy: Secondary | ICD-10-CM

## 2011-06-13 DIAGNOSIS — Z951 Presence of aortocoronary bypass graft: Secondary | ICD-10-CM

## 2011-06-13 DIAGNOSIS — E669 Obesity, unspecified: Secondary | ICD-10-CM | POA: Diagnosis present

## 2011-06-13 DIAGNOSIS — I10 Essential (primary) hypertension: Secondary | ICD-10-CM | POA: Diagnosis present

## 2011-06-13 DIAGNOSIS — Z7902 Long term (current) use of antithrombotics/antiplatelets: Secondary | ICD-10-CM

## 2011-06-13 DIAGNOSIS — K5732 Diverticulitis of large intestine without perforation or abscess without bleeding: Principal | ICD-10-CM | POA: Diagnosis present

## 2011-06-13 DIAGNOSIS — I2510 Atherosclerotic heart disease of native coronary artery without angina pectoris: Secondary | ICD-10-CM | POA: Diagnosis present

## 2011-06-13 DIAGNOSIS — Z8551 Personal history of malignant neoplasm of bladder: Secondary | ICD-10-CM

## 2011-06-13 DIAGNOSIS — F0390 Unspecified dementia without behavioral disturbance: Secondary | ICD-10-CM | POA: Diagnosis present

## 2011-06-13 LAB — URINALYSIS, ROUTINE W REFLEX MICROSCOPIC
Bilirubin Urine: NEGATIVE
Glucose, UA: NEGATIVE mg/dL
Hgb urine dipstick: NEGATIVE
Ketones, ur: NEGATIVE mg/dL
Nitrite: NEGATIVE
Protein, ur: NEGATIVE mg/dL
Specific Gravity, Urine: 1.019 (ref 1.005–1.030)
Urobilinogen, UA: 0.2 mg/dL (ref 0.0–1.0)
pH: 5.5 (ref 5.0–8.0)

## 2011-06-13 LAB — URINE MICROSCOPIC-ADD ON

## 2011-06-14 ENCOUNTER — Emergency Department (HOSPITAL_COMMUNITY): Payer: Medicare Other

## 2011-06-14 LAB — POCT I-STAT TROPONIN I: Troponin i, poc: 0.03 ng/mL (ref 0.00–0.08)

## 2011-06-14 LAB — DIFFERENTIAL
Basophils Absolute: 0 10*3/uL (ref 0.0–0.1)
Basophils Relative: 0 % (ref 0–1)
Eosinophils Absolute: 0 10*3/uL (ref 0.0–0.7)
Neutro Abs: 18.2 10*3/uL — ABNORMAL HIGH (ref 1.7–7.7)
Neutrophils Relative %: 89 % — ABNORMAL HIGH (ref 43–77)

## 2011-06-14 LAB — LACTIC ACID, PLASMA: Lactic Acid, Venous: 1.3 mmol/L (ref 0.5–2.2)

## 2011-06-14 LAB — COMPREHENSIVE METABOLIC PANEL
ALT: 10 U/L (ref 0–35)
AST: 18 U/L (ref 0–37)
CO2: 27 mEq/L (ref 19–32)
Chloride: 95 mEq/L — ABNORMAL LOW (ref 96–112)
Creatinine, Ser: 0.91 mg/dL (ref 0.50–1.10)
GFR calc Af Amer: >60 mL/min (ref >60)
GFR calc non Af Amer: 58 mL/min — ABNORMAL LOW (ref >60)
Glucose, Bld: 138 mg/dL — ABNORMAL HIGH (ref 70–99)
Total Bilirubin: 0.6 mg/dL (ref 0.3–1.2)

## 2011-06-14 LAB — CBC
Hemoglobin: 11.5 g/dL — ABNORMAL LOW (ref 12.0–15.0)
MCHC: 34.4 g/dL (ref 30.0–36.0)
Platelets: 230 10*3/uL (ref 150–400)
RBC: 3.59 MIL/uL — ABNORMAL LOW (ref 3.87–5.11)

## 2011-06-14 LAB — PROTIME-INR: INR: 1.13 (ref 0.00–1.49)

## 2011-06-14 MED ORDER — IOHEXOL 300 MG/ML  SOLN
80.0000 mL | Freq: Once | INTRAMUSCULAR | Status: AC | PRN
  Administered 2011-06-14: 80 mL via INTRAVENOUS

## 2011-06-16 DIAGNOSIS — M79609 Pain in unspecified limb: Secondary | ICD-10-CM

## 2011-06-16 DIAGNOSIS — M7989 Other specified soft tissue disorders: Secondary | ICD-10-CM

## 2011-06-16 LAB — CBC
HCT: 29.6 % — ABNORMAL LOW (ref 36.0–46.0)
Hemoglobin: 10.3 g/dL — ABNORMAL LOW (ref 12.0–15.0)
MCH: 32.6 pg (ref 26.0–34.0)
MCHC: 34.8 g/dL (ref 30.0–36.0)
MCV: 93.7 fL (ref 78.0–100.0)
Platelets: 205 10*3/uL (ref 150–400)
RBC: 3.16 MIL/uL — ABNORMAL LOW (ref 3.87–5.11)
RDW: 12.7 % (ref 11.5–15.5)
WBC: 7.1 10*3/uL (ref 4.0–10.5)

## 2011-06-20 LAB — CULTURE, BLOOD (ROUTINE X 2)
Culture  Setup Time: 201209250836
Culture: NO GROWTH

## 2011-06-23 NOTE — H&P (Signed)
NAME:  Heather [name], Heather [name] NO.:  [id number]  MEDICAL RECORD NO.:  [id number]  LOCATION:                                 FACILITY:  PHYSICIAN:  Vania Rea, M.D. DATE OF BIRTH:  [birth year]  DATE OF ADMISSION:  06/14/2011 DATE OF DISCHARGE:                             HISTORY & PHYSICAL   PRIMARY CARE PHYSICIAN:  Theressa Millard, MD  DICTATING PHYSICIAN:  Vania Rea, MD  CHIEF COMPLAINT:  Abdominal pain and fever.  HISTORY OF PRESENT ILLNESS:  This is a 77 year old Caucasian lady with a history of dementia, coronary artery disease, hypertension who spiked a temperature of 103 yesterday morning, was having lower abdominal pain, and was eventually brought to the emergency room by her family for consideration of urinary tract infection.  The patient was evaluated and no evidence of urinary tract infection was found.  The patient was found to have left lower quadrant abdominal tenderness and a CAT scan of the abdomen was suggestive of sigmoid inflammation. Hospitalist Service was called to assist with management of possible diverticulitis.  The patient has dementia and is not oriented to place nor the reason why she is here and is unable to contribute further to the history.  Her son who looks after her and grandchildren are present.  PAST MEDICAL HISTORY:  Hypertension, coronary artery disease, dementia, history of bladder cancer, pernicious anemia, compression fracture of the lumbar spine.  MEDICATIONS:  Plavix, metoprolol, HCTZ, calcium, monthly B12 shots, and acetaminophen.  ALLERGIES:  She has no known drug allergies but she is extremely intolerant of NARCOTICS, become extreme delirious and combative when these are administered.  She can only tolerate small doses of tramadol.  SOCIAL HISTORY:  Lives on the family's supervision.  FAMILY HISTORY:  Unable to obtain details due to the patient's mental status.  REVIEW OF SYSTEMS:  Other than noted  above unable to obtain.  PHYSICAL EXAMINATION:  GENERAL:  Mildly obese elderly Caucasian lady lying flat in the stretcher, cooperative with the exam.  She looks moderately dehydrated. VITALS:  Her temperature is 98.6, pulse 69, respirations 16, blood pressure 104/46.  She is saturating at 98% on 2 liters. HEENT:  Her pupils are round and equal.  Mucous membranes are pink and anicteric.  No cervical lymphadenopathy.  No thyromegaly or carotid bruit. NECK:  No jugular venous distention. CHEST:  Clear to auscultation bilaterally. CARDIOVASCULAR SYSTEM:  Regular rhythm, 2/6 systolic murmur. ABDOMEN:  Obese, soft.  She does not display any tenderness. EXTREMITIES:  She has large bruise of the left shin with a tearing of her skin, although the tear is not open and there is no weeping.  Other minor bruises on the right shin, arthritic deformities of the hands and knees.  There is no edema.  Dorsalis pedis pulses are 2+ bilaterally. CENTRAL NERVOUS SYSTEM:  Cranial nerves II-XII grossly intact.  She has no focal lateralizing signs.  The gait was not tested.  LABORATORY DATA:  Her white count is elevated at 20.5, hemoglobin 11.5, MCV 93, platelets 230,000, neutrophils 89.  She has 18.2% neutrophils. Her lactic acid was 1.3.  Sodium was 133, potassium 3.5, chloride 95, CO2 27, glucose  133, BUN 19, creatinine 0.9.  Her liver functions are unremarkable.  Her lipase was normal.  Urinalysis shows clear urine, small amount of leukocyte esterase, otherwise unremarkable.  Urine microscopy is unremarkable.  CT scan of the abdomen and pelvis with contrast shows diffuse diverticulosis along the sigmoid with possible adjacent soft tissue stranding not to characterize because of mental artifact, scattered diverticulosis throughout the remainder of the colon, calcification along the abdominal aorta and its branches, markedly worsened compression deformity of the L2 body, stable compression deformity of L1.   The gallbladder is mildly distended but otherwise unremarkable.  ASSESSMENT: 1. Probably acute diverticulitis. 2. Dehydration 3. L2 compression fracture progressive. 4. Dementia. 5. Coronary artery disease. 6. Hypertension. 7. Hyperglycemia.  PLAN:  We will admit this lady for empiric treatment of diverticulitis with parenteral antibiotics.  We will continue management of her chronic medical conditions but we will withhold diuretics and withhold antihypertensives for the time being since her blood pressure is borderline. We will give moderately vigorous hydration considering her age and the likely stiffness of the myocardium.  We will write only for tramadol for severe pain, otherwise Tylenol.  Care will be assumed by Dr. Theressa Millard within the next hour.  Other plans as per orders.     Vania Rea, M.D.     LC/MEDQ  D:  06/14/2011  T:  06/14/2011  Job:  [id number]  cc:   Theressa Millard, M.D.  Electronically Signed by Vania Rea M.D. on 06/23/2011 08:22:35 PM

## 2011-07-02 NOTE — Discharge Summary (Signed)
  NAME:  AMADI, YOSHINO NO.:  [id number]  MEDICAL RECORD NO.:  [id number]  LOCATION:                                 FACILITY:  PHYSICIAN:  Theressa Millard, M.D.    DATE OF BIRTH:  [birth year]  DATE OF ADMISSION:  06/14/2011 DATE OF DISCHARGE:  06/16/2011                              DISCHARGE SUMMARY   ADMITTING DIAGNOSIS:  Fever.  DISCHARGE DIAGNOSES: 1. Diverticulitis. 2. Left leg abrasion with tenderness, deep vein thrombosis, ruled out. 3. Alzheimer's disease. 4. Hypercholesterolemia. 5. Pernicious anemia. 6. Hypothyroidism. 7. Hypertension. 8. Coronary artery disease.  The patient is a 77 year old white female who lives at home with help of family.  She came in with fever.  HOSPITAL COURSE:  The patient was admitted after CT scan revealed evidence of diverticulitis in the left lower quadrant.  She was treated with IV antibiotics (Cipro and Flagyl).  She defervesced rapidly.  She was changed to p.o. antibiotics.  Her diet was advanced and she did extraordinarily well.  She did have some mild confusion in the hospital, but a family member is a hospital employee and was able to spend time with her and that was very effective in keeping her behavior appropriate.  She did have a left leg abrasion which was being treated with OpSite, but associate with this was some left lower calf tenderness.  Ultrasound was negative for DVT.  She is discharged in improved condition and will see me in followup on July 11, 2011.  MEDICATIONS: 1. Cipro 500 mg b.i.d. x7 days. 2. Flagyl 500 mg t.i.d. x7 days. 3. Probiotics which she will obtain from her pharmacy. 4. Calcium carbonate twice daily. 5. Hydrochlorothiazide 25 mg daily. 6. Metoprolol tartrate 25 mg one half tablet daily. 7. Plavix 75 mg daily. 8. Synthroid 25 mcg daily. 9. Tramadol 50 mg q.6 h. p.r.n. pain. 10.Vitamin B12 injections once every other month. 11.Vitamin C tablet daily. 12.Vitamin E  tablet daily. 13.Zetia 10 mg daily.  DIET:  Regular diet will be encouraged.  ACTIVITY:  As tolerated.     Theressa Millard, M.D.     JO/MEDQ  D:  06/24/2011  T:  06/24/2011  Job:  [id number]  Electronically Signed by Theressa Millard M.D. on 07/02/2011 08:47:39 PM

## 2011-11-05 ENCOUNTER — Emergency Department (HOSPITAL_COMMUNITY)
Admission: EM | Admit: 2011-11-05 | Discharge: 2011-11-05 | Disposition: A | Discharge status: Home / Self Care | Payer: Medicare Other | Attending: Emergency Medicine | Admitting: Emergency Medicine

## 2011-11-05 ENCOUNTER — Emergency Department (HOSPITAL_COMMUNITY): Payer: Medicare Other

## 2011-11-05 ENCOUNTER — Encounter (HOSPITAL_COMMUNITY): Payer: Self-pay | Admitting: *Deleted

## 2011-11-05 DIAGNOSIS — W010XXA Fall on same level from slipping, tripping and stumbling without subsequent striking against object, initial encounter: Secondary | ICD-10-CM | POA: Insufficient documentation

## 2011-11-05 DIAGNOSIS — S42213A Unspecified displaced fracture of surgical neck of unspecified humerus, initial encounter for closed fracture: Secondary | ICD-10-CM | POA: Insufficient documentation

## 2011-11-05 DIAGNOSIS — M25539 Pain in unspecified wrist: Secondary | ICD-10-CM | POA: Insufficient documentation

## 2011-11-05 DIAGNOSIS — Y9229 Other specified public building as the place of occurrence of the external cause: Secondary | ICD-10-CM | POA: Insufficient documentation

## 2011-11-05 DIAGNOSIS — M25529 Pain in unspecified elbow: Secondary | ICD-10-CM | POA: Insufficient documentation

## 2011-11-05 DIAGNOSIS — S42209A Unspecified fracture of upper end of unspecified humerus, initial encounter for closed fracture: Secondary | ICD-10-CM

## 2011-11-05 DIAGNOSIS — F0390 Unspecified dementia without behavioral disturbance: Secondary | ICD-10-CM | POA: Insufficient documentation

## 2011-11-05 MED ORDER — TRAMADOL HCL 50 MG PO TABS: 50.0000 mg | ORAL_TABLET | Freq: Four times a day (QID) | ORAL | Status: AC | PRN

## 2011-11-05 MED ORDER — HYDROCODONE-ACETAMINOPHEN 5-325 MG PO TABS
1.0000 | ORAL_TABLET | Freq: Once | ORAL | Status: AC
  Administered 2011-11-05: 1 via ORAL
  Filled 2011-11-05: qty 1

## 2011-11-05 NOTE — Discharge Instructions (Signed)
Shoulder Fracture You have a fractured humerus (bone in the upper arm) at the shoulder just below the ball of the shoulder joint. Most of the time the bones of a broken shoulder are in an acceptable position. Usually the injury can be treated with a shoulder immobilizer or sling and swath bandage. These devices support the arm and prevent any shoulder movement. If the bones are not in a good position, then surgery is sometimes needed. Shoulder fractures usually cause swelling, pain, and discoloration around the upper arm initially. They heal in 8-12 weeks with proper treatment. Rest in bed or a reclining chair as long as your shoulder is very painful. Sitting up generally results in less pain at the fracture site. Do not remove your shoulder bandage until your caregiver approves. You may apply ice packs over the shoulder for 20-30 minutes every 2 hours for the next 2-3 days to reduce the pain and swelling. Use your pain medicine as prescribed.  SEEK IMMEDIATE MEDICAL CARE IF:  You develop severe shoulder pain unrelieved by rest and taking pain medicine.   You have pain, numbness, tingling, or weakness in the hand or wrist.   You develop shortness of breath, chest pain, severe weakness, or fainting.   You have severe pain with motion of the fingers or wrist.  MAKE SURE YOU:   Understand these instructions.   Will watch your condition.   Will get help right away if you are not doing well or get worse.  Document Released: 10/13/2004 Document Revised: 05/18/2011 Document Reviewed: 12/24/2008 [facility] Patient Information 2012 [place], [place].

## 2011-11-05 NOTE — ED Provider Notes (Cosign Needed)
History     CSN: [id number]  Arrival date & time 11/05/11  1311   First MD Initiated Contact with Patient 11/05/11 1341      Chief Complaint  Patient presents with  . Shoulder Pain  . Fall    (Consider location/radiation/quality/duration/timing/severity/associated sxs/prior treatment) HPI Comments: Level 5 caveat due to dementia.  Pt had a mechanical fall while going into a restaurant with family.  Others tried to catch her fall but she still landed on bilateral outstretched arms.  She has pain and swelling noted to left shoulder.  She reports some discomfort in anterior chest area which is reproducible and not associated with SOB, diaphoresis.  Denies neck or back pain.  No abdominal pain or flank pain.  No head injury reported.    Patient is a 78 y.o. female presenting with shoulder pain and fall. The history is provided by the patient and a relative.  Shoulder Pain  Fall    Past Medical History  Diagnosis Date  . Dementia     History reviewed. No pertinent past surgical history.  History reviewed. No pertinent family history.  History  Substance Use Topics  . Smoking status: Not on file  . Smokeless tobacco: Not on file  . Alcohol Use:     OB History    Grav Para Term Preterm Abortions TAB SAB Ect Mult Living                  Review of Systems  Unable to perform ROS: Dementia  All other systems reviewed and are negative.    Allergies  Codeine  Home Medications   Current Outpatient Rx  Name Route Sig Dispense Refill  . CALCIUM-VITAMIN D-VITAMIN K [phone] MG-UNT-MCG PO CHEW Oral Chew by mouth.    . CLOPIDOGREL BISULFATE 75 MG PO TABS Oral Take 75 mg by mouth daily.    Marland Kitchen EZETIMIBE 10 MG PO TABS Oral Take 10 mg by mouth daily.    Marland Kitchen HYDROCHLOROTHIAZIDE 25 MG PO TABS Oral Take 25 mg by mouth daily.    Marland Kitchen METOPROLOL TARTRATE 25 MG PO TABS Oral Take 12.5 mg by mouth 2 (two) times daily.    Marland Kitchen RANITIDINE HCL 75 MG PO TABS Oral Take 75 mg by mouth at  bedtime.    Marland Kitchen VITAMIN E 400 UNITS PO CAPS Oral Take 400 Units by mouth daily.    . TRAMADOL HCL 50 MG PO TABS Oral Take 1 tablet (50 mg total) by mouth every 6 (six) hours as needed for pain. 20 tablet 0    BP 166/55  Pulse 62  Temp 98 F (36.7 C)  Resp 20  SpO2 100%  Physical Exam  Nursing note and vitals reviewed. Constitutional: She appears well-developed and well-nourished. No distress.  HENT:  Head: Normocephalic and atraumatic.  Eyes: Pupils are equal, round, and reactive to light.  Neck: Neck supple. No spinous process tenderness and no muscular tenderness present.  Cardiovascular: Normal rate and intact distal pulses.   Pulses:      Radial pulses are 2+ on the left side.  Pulmonary/Chest: Effort normal.  Abdominal: Soft. She exhibits no distension. There is no tenderness.  Musculoskeletal: She exhibits edema and tenderness.       Arms:      Mildly tender at left wrist and left elbow, difficult to distinguish from radiating pain from left shoulder  Neurological: She is alert.       Strong grip strength of left hand, intact dorsiflexion at wrist.  Skin: Skin is warm, dry and intact. No abrasion and no laceration noted.    ED Course  Procedures (including critical care time)  Labs Reviewed - No data to display Dg Elbow Complete Left  11/05/2011  *RADIOLOGY REPORT*  Clinical Data: Tripped and fell.  Pain.  LEFT ELBOW - COMPLETE 3+ VIEW  Comparison: Left wrist and left shoulder radiographs 11/05/2011.  Findings: Bones are osteopenic.  The elbow is located.  No evidence of joint effusion or acute fracture.  IMPRESSION: No acute bony abnormality.  Original Report Authenticated By: Britta Mccreedy, M.D.   Dg Wrist Complete Left  11/05/2011  *RADIOLOGY REPORT*  Clinical Data: Pain post fall.  LEFT WRIST - COMPLETE 3+ VIEW  Comparison: None.  Findings: Diffuse osteopenia.  Moderate degenerative changes at the first [facility] and STT articulations.  Carpal rows intact.  Negative for  fracture, dislocation, or other acute bony abnormality.  Normal alignment.  Vascular calcifications noted in the distal forearm.  IMPRESSION: 1.  Negative for fracture or other acute bony abnormality. 2.  Osteopenia and degenerative changes as above.  Original Report Authenticated By: Osa Craver, M.D.   Dg Shoulder Left  11/05/2011  *RADIOLOGY REPORT*  Clinical Data: Pain post fall.  LEFT SHOULDER - 2+ VIEW  Comparison: None.  Findings: There is a transverse comminuted fracture across the surgical neck of the left humerus.  Fracture involves the greater tuberosity as a separate fracture fragment. Major fracture fragments are mildly distracted and angulated.  There is a small free osteochondral body projecting above the humeral head.  Scapula appears intact.  There is no dislocation.  IMPRESSION:  1. Comminuted intra-articular fracture, surgical neck left humerus.  Original Report Authenticated By: Thora Lance III, M.D.     1. Fracture, humerus, proximal       MDM  I reviewed plain films.  Proximal humeral shaft fracture, mildly comminuted and displaced.  Intact distal neurovascular status.  Will discuss with ortho.        3:05 PM I spoke to Dr. Cleophas Dunker and described the patient's exam as well as the x-ray findings. He requested the patient be placed in a sling and that she can be followed up in the office early this week. He asked that the patient use ice packs but not directly to the skin. Also mild analgesic prescription Septra tramadol. I discussed this with the patient's family member who is in agreement.  Gavin Pound. Virginie Josten, MD 11/05/11 1506

## 2011-11-05 NOTE — ED Notes (Signed)
Pt in s/o fall, c/o pain to left shoulder, increased pain with movement, pt with history of dementia

## 2013-06-18 ENCOUNTER — Emergency Department (HOSPITAL_COMMUNITY)
Admission: EM | Admit: 2013-06-18 | Discharge: 2013-06-18 | Disposition: A | Discharge status: Home / Self Care | Payer: Medicare Other | Attending: Emergency Medicine | Admitting: Emergency Medicine

## 2013-06-18 DIAGNOSIS — Z7902 Long term (current) use of antithrombotics/antiplatelets: Secondary | ICD-10-CM | POA: Insufficient documentation

## 2013-06-18 DIAGNOSIS — F0390 Unspecified dementia without behavioral disturbance: Secondary | ICD-10-CM | POA: Insufficient documentation

## 2013-06-18 DIAGNOSIS — Z79899 Other long term (current) drug therapy: Secondary | ICD-10-CM | POA: Insufficient documentation

## 2013-06-18 NOTE — ED Provider Notes (Signed)
CSN: [id number]     Arrival date & time 06/18/13  2239 History   First MD Initiated Contact with Patient 06/18/13 2254     Chief Complaint  Patient presents with  . Missing Person    (Consider location/radiation/quality/duration/timing/severity/associated sxs/prior Treatment) HPI Patient presents after he found by the police wandering in the street.  She has no complaints.  She is now eating a cheeseburger, drinking a soda.  She is oriented only to self.     Past Medical History  Diagnosis Date  . Dementia    No past surgical history on file. No family history on file. History  Substance Use Topics  . Smoking status: Not on file  . Smokeless tobacco: Not on file  . Alcohol Use:    OB History   Grav Para Term Preterm Abortions TAB SAB Ect Mult Living                 Review of Systems  Unable to perform ROS: Dementia    Allergies  Codeine  Home Medications   Current Outpatient Rx  Name  Route  Sig  Dispense  Refill  . Calcium-Vitamin D-Vitamin K (CALCIUM + D) [phone] MG-UNT-MCG CHEW   Oral   Chew by mouth.         . clopidogrel (PLAVIX) 75 MG tablet   Oral   Take 75 mg by mouth daily.         Marland Kitchen ezetimibe (ZETIA) 10 MG tablet   Oral   Take 10 mg by mouth daily.         . hydrochlorothiazide (HYDRODIURIL) 25 MG tablet   Oral   Take 25 mg by mouth daily.         . metoprolol tartrate (LOPRESSOR) 25 MG tablet   Oral   Take 12.5 mg by mouth 2 (two) times daily.         . ranitidine (ZANTAC) 75 MG tablet   Oral   Take 75 mg by mouth at bedtime.         . vitamin E (VITAMIN E) 400 UNIT capsule   Oral   Take 400 Units by mouth daily.          BP 171/52  Pulse 62  Temp(Src) 98.9 F (37.2 C) (Oral)  Resp 20  SpO2 98% Physical Exam  Nursing note and vitals reviewed. Constitutional: She is oriented to person, place, and time. She appears well-developed and well-nourished. No distress.  HENT:  Head: Normocephalic and atraumatic.   Eyes: Conjunctivae and EOM are normal.  Pulmonary/Chest: No stridor. No respiratory distress.  Abdominal: She exhibits no distension.  Musculoskeletal: She exhibits no edema.  Neurological: She is alert and oriented to person, place, and time. No cranial nerve deficit.  Skin: Skin is warm and dry.  Psychiatric: Her mood appears not anxious. Cognition and memory are impaired.    ED Course  Procedures (including critical care time) Labs Review Labs Reviewed - No data to display Imaging Review No results found. After the initial evaluation the police located a family member for this patient.  Update: Family member has arrived.  She states that the patient is behaving in a typical fashion for her.  Patient has still no complaints. MDM   1. Dementia    This elderly female with dementia presents after being found outside by police.  Patient escaped her house, and upon arrival of a family member to confirm the patient has dementia, is acting at baseline.  Patient  was discharged into the company of her daughter.    Gerhard Munch, MD 06/18/13 2322

## 2013-06-18 NOTE — ED Notes (Signed)
Patient's granddaughter here to pick up patient. Patient and granddaughter left ER ambulatory.

## 2013-06-18 NOTE — ED Notes (Signed)
Per EMS, patient was found at the intersection of [address] walking. Patient was approached by neighbors and patient did not know where she was. Patient had no complaints and no injuries.

## 2013-10-01 ENCOUNTER — Emergency Department (HOSPITAL_COMMUNITY): Payer: Medicare Other

## 2013-10-01 ENCOUNTER — Emergency Department (HOSPITAL_COMMUNITY)
Admission: EM | Admit: 2013-10-01 | Discharge: 2013-10-01 | Disposition: A | Discharge status: Home / Self Care | Payer: Medicare Other | Attending: Emergency Medicine | Admitting: Emergency Medicine

## 2013-10-01 ENCOUNTER — Encounter (HOSPITAL_COMMUNITY): Payer: Self-pay | Admitting: Emergency Medicine

## 2013-10-01 DIAGNOSIS — F0390 Unspecified dementia without behavioral disturbance: Secondary | ICD-10-CM | POA: Insufficient documentation

## 2013-10-01 DIAGNOSIS — Y92009 Unspecified place in unspecified non-institutional (private) residence as the place of occurrence of the external cause: Secondary | ICD-10-CM | POA: Insufficient documentation

## 2013-10-01 DIAGNOSIS — S4980XA Other specified injuries of shoulder and upper arm, unspecified arm, initial encounter: Secondary | ICD-10-CM | POA: Insufficient documentation

## 2013-10-01 DIAGNOSIS — S0003XA Contusion of scalp, initial encounter: Secondary | ICD-10-CM | POA: Insufficient documentation

## 2013-10-01 DIAGNOSIS — Z7902 Long term (current) use of antithrombotics/antiplatelets: Secondary | ICD-10-CM | POA: Insufficient documentation

## 2013-10-01 DIAGNOSIS — S46909A Unspecified injury of unspecified muscle, fascia and tendon at shoulder and upper arm level, unspecified arm, initial encounter: Secondary | ICD-10-CM | POA: Insufficient documentation

## 2013-10-01 DIAGNOSIS — Y939 Activity, unspecified: Secondary | ICD-10-CM | POA: Insufficient documentation

## 2013-10-01 DIAGNOSIS — W19XXXA Unspecified fall, initial encounter: Secondary | ICD-10-CM | POA: Insufficient documentation

## 2013-10-01 DIAGNOSIS — S0083XA Contusion of other part of head, initial encounter: Secondary | ICD-10-CM

## 2013-10-01 DIAGNOSIS — Z8781 Personal history of (healed) traumatic fracture: Secondary | ICD-10-CM | POA: Insufficient documentation

## 2013-10-01 DIAGNOSIS — Z79899 Other long term (current) drug therapy: Secondary | ICD-10-CM | POA: Insufficient documentation

## 2013-10-01 DIAGNOSIS — Z9889 Other specified postprocedural states: Secondary | ICD-10-CM | POA: Insufficient documentation

## 2013-10-01 DIAGNOSIS — S1093XA Contusion of unspecified part of neck, initial encounter: Principal | ICD-10-CM

## 2013-10-01 HISTORY — DX: Unspecified fracture of shaft of humerus, unspecified arm, initial encounter for closed fracture: S42.309A

## 2013-10-01 HISTORY — DX: Wedge compression fracture of unspecified lumbar vertebra, initial encounter for closed fracture: S32.000A

## 2013-10-01 LAB — POCT I-STAT, CHEM 8
BUN: 22 mg/dL (ref 6–23)
CHLORIDE: 103 meq/L (ref 96–112)
Calcium, Ion: 1.23 mmol/L (ref 1.13–1.30)
Creatinine, Ser: 0.9 mg/dL (ref 0.50–1.10)
GLUCOSE: 109 mg/dL — AB (ref 70–99)
HCT: 37 % (ref 36.0–46.0)
Hemoglobin: 12.6 g/dL (ref 12.0–15.0)
POTASSIUM: 3.8 meq/L (ref 3.7–5.3)
Sodium: 142 mEq/L (ref 137–147)
TCO2: 27 mmol/L (ref 0–100)

## 2013-10-01 MED ORDER — ACETAMINOPHEN 500 MG PO TABS
1000.0000 mg | ORAL_TABLET | Freq: Once | ORAL | Status: AC
  Administered 2013-10-01: 1000 mg via ORAL
  Filled 2013-10-01: qty 2

## 2013-10-01 NOTE — ED Notes (Signed)
Performed mouth care. Old dark blood assessed from nose and mouth.

## 2013-10-01 NOTE — ED Notes (Signed)
80 yo Female c/o fall on right side early this morning. Son was present and states no LOC. HX of alzheimer's and dementia and currently on plavix. Vitals stable 140/90, HR 80s. Pain is upper body across shoulders and arms.

## 2013-10-01 NOTE — ED Provider Notes (Signed)
CSN: [id number]     Arrival date & time 10/01/13  0706 History   First MD Initiated Contact with Patient 10/01/13 [phone]     Chief Complaint  Patient presents with  . Fall   Level V caveat: Dementia  HPI As reported that the patient has a history of dementia and she paces the floor all night long.  She was found by family laying on her left side this morning in her bedroom.  Unknown time when she fell.  Patient with evidence of left-sided facial trauma.  Reports that she's also been complaining of pain in both of her shoulders.  Patient has a history of dementia and is on Plavix.  They state currently at baseline mental status.  No recent illness.  Her appetite has been good as of lately.  No reported vomiting or diarrhea   Past Medical History  Diagnosis Date  . Dementia   . Fracture closed, humerus   . Lumbar compression fracture    Past Surgical History  Procedure Laterality Date  . Cardiac surgery  1995  . Hip surgery      Replacement   History reviewed. No pertinent family history. History  Substance Use Topics  . Smoking status: Never Smoker   . Smokeless tobacco: Not on file  . Alcohol Use: No   OB History   Grav Para Term Preterm Abortions TAB SAB Ect Mult Living                 Review of Systems  Unable to perform ROS   Allergies  Codeine  Home Medications   Current Outpatient Rx  Name  Route  Sig  Dispense  Refill  . calcium-vitamin D (OSCAL WITH D) 500-200 MG-UNIT per tablet   Oral   Take 1 tablet by mouth 2 (two) times daily.         . clopidogrel (PLAVIX) 75 MG tablet   Oral   Take 75 mg by mouth daily.         . hydrochlorothiazide (HYDRODIURIL) 25 MG tablet   Oral   Take 25 mg by mouth daily.         . metoprolol tartrate (LOPRESSOR) 25 MG tablet   Oral   Take 12.5 mg by mouth 2 (two) times daily.         . ranitidine (ZANTAC) 75 MG tablet   Oral   Take 75 mg by mouth at bedtime.         . vitamin B-12 (CYANOCOBALAMIN) 1000 MCG  tablet   Oral   Take 1,000 mcg by mouth daily.         . vitamin E (VITAMIN E) 400 UNIT capsule   Oral   Take 400 Units by mouth daily.          BP 124/49  Pulse 79  Temp(Src) 98.2 F (36.8 C) (Oral)  Resp 16  SpO2 100% Physical Exam  Nursing note and vitals reviewed. Constitutional: She appears well-developed and well-nourished. No distress.  HENT:  Head: Normocephalic and atraumatic.  Left-sided facial swelling with tenderness around the left infraorbital rim as well as a left maxillary sinus.  No trismus or malocclusion.  Dentition is degenerative without acute abnormalities.  No active bleeding from her nose or mouth.  Posterior pharynx is patent.  Extraocular movements are intact  Eyes: EOM are normal.  Neck: Normal range of motion.  Cardiovascular: Normal rate, regular rhythm and normal heart sounds.   Pulmonary/Chest: Effort normal and breath  sounds normal. She exhibits no tenderness.  Abdominal: Soft. She exhibits no distension. There is no tenderness.  Musculoskeletal:  Pain with palpation and range of motion of her bilateral shoulders and tenderness of her bilateral proximal humerus is.  Note tenderness and normal range of motion bilateral wrists and elbows.  No tenderness along her bilateral forearms.  No obvious deformity of her arms.  Full range of motion of her bilateral ankles knees and hips.  Neurological: She is alert.  Follow simple commands  Skin: Skin is warm and dry.  Psychiatric: She has a normal mood and affect. Judgment normal.    ED Course  Procedures (including critical care time) Labs Review Labs Reviewed  POCT I-STAT, CHEM 8 - Abnormal; Notable for the following:    Glucose, Bld 109 (*)    All other components within normal limits   Imaging Review Ct Head Wo Contrast  10/01/2013   CLINICAL DATA:  Fall on right-side. Hematoma left peri orbital region. Alzheimer's. On Plavix.  EXAM: CT HEAD WITHOUT CONTRAST  CT MAXILLOFACIAL WITHOUT CONTRAST   CT CERVICAL SPINE WITHOUT CONTRAST  TECHNIQUE: Multidetector CT imaging of the head, cervical spine, and maxillofacial structures were performed using the standard protocol without intravenous contrast. Multiplanar CT image reconstructions of the cervical spine and maxillofacial structures were also generated.  COMPARISON:  11/12/2010 CT head and cervical spine.  FINDINGS: CT HEAD FINDINGS  No skull fracture or intracranial hemorrhage.  Prominent small vessel disease type changes without CT evidence of large acute infarct.  Global atrophy. Ventricular prominence may be related to atrophy rather than hydrocephalus. Subtle component of hydrocephalus would be difficult to completely exclude. Appearance unchanged.  No intracranial mass lesion noted on this unenhanced exam.  Vascular calcifications.  CT MAXILLOFACIAL FINDINGS  Inwardly displaced fracture of the anterior wall of the left maxillary sinus. Buckling of the posterior lateral wall of the left maxillary sinus. Fluid within the left maxillary sinus appears dense consistent with hemorrhage. No evidence of left orbital floor fracture.  Mild hematoma inferior to the left orbit and overlying the left cheek.  Orbital structures appear intact.  Degenerative changes left temporal mandibular joint.  CT CERVICAL SPINE FINDINGS  No cervical spine fracture.  No abnormal prevertebral soft tissue swelling.  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  Transverse ligament hypertrophy with mild spinal stenosis.  Minimal anterior slip C3 and C4 unchanged. If there is a high clinical suspicion of ligamentous injury, flexion and extension views or MR can be performed for further delineation. Cervical spondylotic changes most prominent C5-6 level. Various degrees of spinal stenosis and foraminal narrowing most prominent C3-4 through C5-6.  IMPRESSION: No skull fracture or intracranial hemorrhage.  Inwardly displaced fracture of the anterior wall of the left maxillary sinus. Buckling of the posterior  lateral wall of the left maxillary sinus. Fluid within the left maxillary sinus appears dense consistent with hemorrhage. No evidence of left orbital floor fracture. Mild hematoma inferior to the left orbit and overlying the left cheek.  No cervical spine fracture. Please see above for additional findings.   Electronically Signed   By: Chauncey Cruel M.D.   On: 10/01/2013 09:03   Ct Cervical Spine Wo Contrast  10/01/2013   CLINICAL DATA:  Fall on right-side. Hematoma left peri orbital region. Alzheimer's. On Plavix.  EXAM: CT HEAD WITHOUT CONTRAST  CT MAXILLOFACIAL WITHOUT CONTRAST  CT CERVICAL SPINE WITHOUT CONTRAST  TECHNIQUE: Multidetector CT imaging of the head, cervical spine, and maxillofacial structures were performed using the standard protocol  without intravenous contrast. Multiplanar CT image reconstructions of the cervical spine and maxillofacial structures were also generated.  COMPARISON:  11/12/2010 CT head and cervical spine.  FINDINGS: CT HEAD FINDINGS  No skull fracture or intracranial hemorrhage.  Prominent small vessel disease type changes without CT evidence of large acute infarct.  Global atrophy. Ventricular prominence may be related to atrophy rather than hydrocephalus. Subtle component of hydrocephalus would be difficult to completely exclude. Appearance unchanged.  No intracranial mass lesion noted on this unenhanced exam.  Vascular calcifications.  CT MAXILLOFACIAL FINDINGS  Inwardly displaced fracture of the anterior wall of the left maxillary sinus. Buckling of the posterior lateral wall of the left maxillary sinus. Fluid within the left maxillary sinus appears dense consistent with hemorrhage. No evidence of left orbital floor fracture.  Mild hematoma inferior to the left orbit and overlying the left cheek.  Orbital structures appear intact.  Degenerative changes left temporal mandibular joint.  CT CERVICAL SPINE FINDINGS  No cervical spine fracture.  No abnormal prevertebral soft  tissue swelling.  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  Transverse ligament hypertrophy with mild spinal stenosis.  Minimal anterior slip C3 and C4 unchanged. If there is a high clinical suspicion of ligamentous injury, flexion and extension views or MR can be performed for further delineation. Cervical spondylotic changes most prominent C5-6 level. Various degrees of spinal stenosis and foraminal narrowing most prominent C3-4 through C5-6.  IMPRESSION: No skull fracture or intracranial hemorrhage.  Inwardly displaced fracture of the anterior wall of the left maxillary sinus. Buckling of the posterior lateral wall of the left maxillary sinus. Fluid within the left maxillary sinus appears dense consistent with hemorrhage. No evidence of left orbital floor fracture. Mild hematoma inferior to the left orbit and overlying the left cheek.  No cervical spine fracture. Please see above for additional findings.   Electronically Signed   By: Chauncey Cruel M.D.   On: 10/01/2013 09:03   Dg Humerus Left  10/01/2013   CLINICAL DATA:  [place].  Pain.  EXAM: LEFT HUMERUS - 2+ VIEW  COMPARISON:  11/05/2011.  FINDINGS: Remote left humeral head/ neck fracture. Prominent left glenohumeral joint degenerative changes. Mild acromioclavicular joint degenerative changes.  No acute fracture. If there is elbow pain, cone-down elbow series recommended.  IMPRESSION: Remote left humeral head/ neck fracture. Prominent left glenohumeral joint degenerative changes. Mild acromioclavicular joint degenerative changes.  No acute fracture. If there is elbow pain, cone-down elbow series recommended.   Electronically Signed   By: Chauncey Cruel M.D.   On: 10/01/2013 08:28   Dg Humerus Right  10/01/2013   CLINICAL DATA:  Fall.  Pain.  EXAM: RIGHT HUMERUS - 2+ VIEW  COMPARISON:  11/10/2009.  FINDINGS: Marked right glenohumeral joint degenerative changes. Moderate acromioclavicular joint degenerative changes.  No fracture. If there is elbow pain, cone-down elbow series  recommended.  IMPRESSION: Marked right glenohumeral joint degenerative changes. Moderate acromioclavicular joint degenerative changes.  No fracture. If there is elbow pain, cone-down elbow series recommended.   Electronically Signed   By: Chauncey Cruel M.D.   On: 10/01/2013 08:30   Ct Maxillofacial Wo Cm  10/01/2013   CLINICAL DATA:  Fall on right-side. Hematoma left peri orbital region. Alzheimer's. On Plavix.  EXAM: CT HEAD WITHOUT CONTRAST  CT MAXILLOFACIAL WITHOUT CONTRAST  CT CERVICAL SPINE WITHOUT CONTRAST  TECHNIQUE: Multidetector CT imaging of the head, cervical spine, and maxillofacial structures were performed using the standard protocol without intravenous contrast. Multiplanar CT image reconstructions of the cervical spine  and maxillofacial structures were also generated.  COMPARISON:  11/12/2010 CT head and cervical spine.  FINDINGS: CT HEAD FINDINGS  No skull fracture or intracranial hemorrhage.  Prominent small vessel disease type changes without CT evidence of large acute infarct.  Global atrophy. Ventricular prominence may be related to atrophy rather than hydrocephalus. Subtle component of hydrocephalus would be difficult to completely exclude. Appearance unchanged.  No intracranial mass lesion noted on this unenhanced exam.  Vascular calcifications.  CT MAXILLOFACIAL FINDINGS  Inwardly displaced fracture of the anterior wall of the left maxillary sinus. Buckling of the posterior lateral wall of the left maxillary sinus. Fluid within the left maxillary sinus appears dense consistent with hemorrhage. No evidence of left orbital floor fracture.  Mild hematoma inferior to the left orbit and overlying the left cheek.  Orbital structures appear intact.  Degenerative changes left temporal mandibular joint.  CT CERVICAL SPINE FINDINGS  No cervical spine fracture.  No abnormal prevertebral soft tissue swelling.  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  Transverse ligament hypertrophy with mild spinal stenosis.  Minimal  anterior slip C3 and C4 unchanged. If there is a high clinical suspicion of ligamentous injury, flexion and extension views or MR can be performed for further delineation. Cervical spondylotic changes most prominent C5-6 level. Various degrees of spinal stenosis and foraminal narrowing most prominent C3-4 through C5-6.  IMPRESSION: No skull fracture or intracranial hemorrhage.  Inwardly displaced fracture of the anterior wall of the left maxillary sinus. Buckling of the posterior lateral wall of the left maxillary sinus. Fluid within the left maxillary sinus appears dense consistent with hemorrhage. No evidence of left orbital floor fracture. Mild hematoma inferior to the left orbit and overlying the left cheek.  No cervical spine fracture. Please see above for additional findings.   Electronically Signed   By: Chauncey Cruel M.D.   On: 10/01/2013 09:03  I personally reviewed the imaging tests through PACS system I reviewed available ER/hospitalization records through the EMR   EKG Interpretation   None       MDM   1. Fall   2. Facial contusion    Small anterior left maxillary sinus fracture.  No other fractures.  Patient baseline mental status.  Discharge home in good condition.  PCP followup.    Hoy Morn, MD 10/01/13 [phone]

## 2013-10-01 NOTE — ED Notes (Signed)
MD at bedside. 

## 2014-01-01 ENCOUNTER — Encounter (HOSPITAL_COMMUNITY): Payer: Self-pay | Admitting: Emergency Medicine

## 2014-01-01 ENCOUNTER — Emergency Department (HOSPITAL_COMMUNITY)
Admission: EM | Admit: 2014-01-01 | Discharge: 2014-01-02 | Disposition: A | Discharge status: Home / Self Care | Payer: Medicare Other | Attending: Emergency Medicine | Admitting: Emergency Medicine

## 2014-01-01 DIAGNOSIS — S51019A Laceration without foreign body of unspecified elbow, initial encounter: Secondary | ICD-10-CM

## 2014-01-01 DIAGNOSIS — Z9889 Other specified postprocedural states: Secondary | ICD-10-CM | POA: Insufficient documentation

## 2014-01-01 DIAGNOSIS — S79929A Unspecified injury of unspecified thigh, initial encounter: Secondary | ICD-10-CM

## 2014-01-01 DIAGNOSIS — F0390 Unspecified dementia without behavioral disturbance: Secondary | ICD-10-CM | POA: Insufficient documentation

## 2014-01-01 DIAGNOSIS — Y9210 Unspecified residential institution as the place of occurrence of the external cause: Secondary | ICD-10-CM | POA: Insufficient documentation

## 2014-01-01 DIAGNOSIS — S51009A Unspecified open wound of unspecified elbow, initial encounter: Secondary | ICD-10-CM | POA: Insufficient documentation

## 2014-01-01 DIAGNOSIS — R296 Repeated falls: Secondary | ICD-10-CM | POA: Insufficient documentation

## 2014-01-01 DIAGNOSIS — Z8781 Personal history of (healed) traumatic fracture: Secondary | ICD-10-CM | POA: Insufficient documentation

## 2014-01-01 DIAGNOSIS — R233 Spontaneous ecchymoses: Secondary | ICD-10-CM | POA: Insufficient documentation

## 2014-01-01 DIAGNOSIS — S79919A Unspecified injury of unspecified hip, initial encounter: Secondary | ICD-10-CM | POA: Insufficient documentation

## 2014-01-01 DIAGNOSIS — W19XXXA Unspecified fall, initial encounter: Secondary | ICD-10-CM

## 2014-01-01 DIAGNOSIS — Y9389 Activity, other specified: Secondary | ICD-10-CM | POA: Insufficient documentation

## 2014-01-01 NOTE — ED Notes (Addendum)
Pt arrives via EMS from [facility]. EMS called by facility, pt had unwitnessed ground level fall on carpet. No new deficits found. Pt does have alzheimers and suffers from chronic pain. Pt only new complaint in right elbow pain, skin tear noted to rt elbow. EMS states that pt was able to stand and walk to the stretcher. BP 140/70 HR 76. GCS 15. Family states that pt has been limping on her rt leg for a few days.

## 2014-01-01 NOTE — ED Notes (Signed)
Dr Dina Rich in room

## 2014-01-02 ENCOUNTER — Emergency Department (HOSPITAL_COMMUNITY): Payer: Medicare Other

## 2014-01-02 LAB — URINALYSIS, ROUTINE W REFLEX MICROSCOPIC
Bilirubin Urine: NEGATIVE
GLUCOSE, UA: NEGATIVE mg/dL
Hgb urine dipstick: NEGATIVE
Ketones, ur: NEGATIVE mg/dL
LEUKOCYTES UA: NEGATIVE
Nitrite: NEGATIVE
PH: 6.5 (ref 5.0–8.0)
Protein, ur: NEGATIVE mg/dL
SPECIFIC GRAVITY, URINE: 1.021 (ref 1.005–1.030)
Urobilinogen, UA: 0.2 mg/dL (ref 0.0–1.0)

## 2014-01-02 NOTE — Discharge Instructions (Signed)
Fall Prevention and Home Safety Falls cause injuries and can affect all age groups. It is possible to prevent falls.  HOW TO PREVENT FALLS  Wear shoes with rubber soles that do not have an opening for your toes.  Keep the inside and outside of your house well lit.  Use night lights throughout your home.  Remove clutter from floors.  Clean up floor spills.  Remove throw rugs or fasten them to the floor with carpet tape.  Do not place electrical cords across pathways.  Put grab bars by your tub, shower, and toilet. Do not use towel bars as grab bars.  Put handrails on both sides of the stairway. Fix loose handrails.  Do not climb on stools or stepladders, if possible.  Do not wax your floors.  Repair uneven or unsafe sidewalks, walkways, or stairs.  Keep items you use a lot within reach.  Be aware of pets.  Keep emergency numbers next to the telephone.  Put smoke detectors in your home and near bedrooms. Ask your doctor what other things you can do to prevent falls. Document Released: 07/02/2009 Document Revised: 03/06/2012 Document Reviewed: 12/06/2011 [facility] Patient Information 2014 [place], [place]. Skin Tear Care A skin tear is a wound in which the top layer of skin has peeled off. This is a common problem with aging because the skin becomes thinner and more fragile as a person gets older. In addition, some medicines, such as oral corticosteroids, can lead to skin thinning if taken for long periods of time.  A skin tear is often repaired with tape or skin adhesive strips. This keeps the skin that has been peeled off in contact with the healthier skin beneath. Depending on the location of the wound, a bandage (dressing) may be applied over the tape or skin adhesive strips. Sometimes, during the healing process, the skin turns black and dies. Even when this happens, the torn skin acts as a good dressing until the skin underneath gets healthier and repairs itself. HOME CARE  INSTRUCTIONS   Change dressings once per day or as directed by your caregiver.  Gently clean the skin tear and the area around the tear using saline solution or mild soap and water.  Do not rub the injured skin dry. Let the area air dry.  Apply petroleum jelly or an antibiotic cream or ointment to keep the tear moist. This will help the wound heal. Do not allow a scab to form.  If the dressing sticks before the next dressing change, moisten it with warm soapy water and gently remove it.  Protect the injured skin until it has healed.  Only take over-the-counter or prescription medicines as directed by your caregiver.  Take showers or baths using warm soapy water. Apply a new dressing after the shower or bath.  Keep all follow-up appointments as directed by your caregiver.  SEEK IMMEDIATE MEDICAL CARE IF:   You have redness, swelling, or increasing pain in the skin tear.  You havepus coming from the skin tear.  You have chills.  You have a red streak that goes away from the skin tear.  You have a bad smell coming from the tear or dressing.  You have a fever or persistent symptoms for more than 2 3 days.  You have a fever and your symptoms suddenly get worse. MAKE SURE YOU:  Understand these instructions.  Will watch this condition.  Will get help right away if your child is not doing well or gets worse. Document  Released: 05/31/2001 Document Revised: 05/30/2012 Document Reviewed: 03/19/2012 [facility] Patient Information 2014 [place], [place].

## 2014-01-02 NOTE — ED Notes (Signed)
Pt taken to CT.

## 2014-01-02 NOTE — ED Provider Notes (Signed)
CSN: [id number]     Arrival date & time 01/01/14  2326 History   First MD Initiated Contact with Patient 01/01/14 2337     Chief Complaint  Patient presents with  . Fall     (Consider location/radiation/quality/duration/timing/severity/associated sxs/prior Treatment) HPI  This is a 80 year old female with history of dementia who presents from her living facility following an unwitnessed fall. Patient is completely disoriented and is unable to contribute to history taking. Per EMS report,  patient had a unwitnessed ground-level fall onto carpet. She appears to be her baseline per report. She was noted to have a skin tear to the right elbow. Patient was able to stand and ambulate to the stretcher. Patient's family is at the bedside. She is DO NOT RESUSCITATE/DO NOT INTUBATE per her power of attorney.  They state the patient does appear to be her baseline.  Level V caveat for dementia  Past Medical History  Diagnosis Date  . Dementia   . Fracture closed, humerus   . Lumbar compression fracture    Past Surgical History  Procedure Laterality Date  . Cardiac surgery  1995  . Hip surgery      Replacement   No family history on file. History  Substance Use Topics  . Smoking status: Never Smoker   . Smokeless tobacco: Not on file  . Alcohol Use: No   OB History   Grav Para Term Preterm Abortions TAB SAB Ect Mult Living                 Review of Systems  Unable to perform ROS: Dementia      Allergies  Codeine  Home Medications   Prior to Admission medications   Medication Sig Start Date End Date Taking? Authorizing Provider  Calcium Carbonate-Vitamin D (CALCIUM 600+D) 600-200 MG-UNIT TABS Take 1 tablet by mouth 2 (two) times daily.   Yes Historical Provider, MD  hydrochlorothiazide (HYDRODIURIL) 25 MG tablet Take 25 mg by mouth daily.   Yes Historical Provider, MD  levothyroxine (SYNTHROID, LEVOTHROID) 25 MCG tablet Take 25 mcg by mouth daily before breakfast.   Yes  Historical Provider, MD  metoprolol tartrate (LOPRESSOR) 25 MG tablet Take 12.5 mg by mouth 2 (two) times daily.   Yes Historical Provider, MD  vitamin B-12 (CYANOCOBALAMIN) 1000 MCG tablet Take 1,000 mcg by mouth daily.   Yes Historical Provider, MD  vitamin E (VITAMIN E) 400 UNIT capsule Take 400 Units by mouth daily.   Yes Historical Provider, MD   BP 159/35  Pulse 66  Temp(Src) 97.8 F (36.6 C) (Oral)  Resp 22  SpO2 98% Physical Exam  Nursing note and vitals reviewed. Constitutional:  Elderly, no acute distress  HENT:  Head: Normocephalic and atraumatic.  Mouth/Throat: Oropharynx is clear and moist.  Eyes: EOM are normal. Pupils are equal, round, and reactive to light.  Neck: Normal range of motion. Neck supple.  No midline C-spine tenderness  Cardiovascular: Normal rate, regular rhythm and normal heart sounds.   No murmur heard. Pulmonary/Chest: Effort normal and breath sounds normal. No respiratory distress. She has no wheezes.  Abdominal: Soft. There is no tenderness.  Musculoskeletal: Normal range of motion.  Patient appears to be in pain with range of motion of the right hip  Neurological: She is alert.  Moves all 4 extremities  Skin: Skin is warm and dry.  Quarter-sized skin tear to the right elbow, ecchymosis noted to the bilateral shins which appear old  Psychiatric: She has a normal mood  and affect.    ED Course  Procedures (including critical care time) Labs Review Labs Reviewed  URINALYSIS, ROUTINE W REFLEX MICROSCOPIC    Imaging Review Dg Elbow Complete Right  01/02/2014   CLINICAL DATA:  Right elbow pain status post fall  EXAM: RIGHT ELBOW - COMPLETE 3+ VIEW  COMPARISON:  None.  FINDINGS: The lateral view it is limited secondary to the obliquity. There is no evidence of fracture, dislocation, or joint effusion. There is no evidence of arthropathy or other focal bone abnormality. There is a soft tissue laceration over the olecranon.  IMPRESSION: Limited  lateral view secondary to obliquity. Otherwise no acute osseous injury of the right elbow.   Electronically Signed   By: Kathreen Devoid   On: 01/02/2014 00:47   Dg Hip Complete Right  01/02/2014   CLINICAL DATA:  Right leg pain  EXAM: RIGHT HIP - COMPLETE 2+ VIEW  COMPARISON:  None.  FINDINGS: There is generalized osteopenia. There is a right hip arthroplasty without failure or complication. There is no acute fracture or dislocation. There is moderate osteoarthritis of the left hip. There are degenerative changes of the lower lumbar spine and bilateral SI joints.  IMPRESSION: Right total hip arthroplasty without failure or complication. No right hip fracture or dislocation.   Electronically Signed   By: Kathreen Devoid   On: 01/02/2014 00:46   Ct Head Wo Contrast  01/02/2014   CLINICAL DATA:  Unwitnessed fall, chronic pain, limping on right leg  EXAM: CT HEAD WITHOUT CONTRAST  CT CERVICAL SPINE WITHOUT CONTRAST  TECHNIQUE: Multidetector CT imaging of the head and cervical spine was performed following the standard protocol without intravenous contrast. Multiplanar CT image reconstructions of the cervical spine were also generated.  COMPARISON:  CT HEAD W/O CM dated 10/01/2013; CT C SPINE W/O CM dated 10/01/2013; CT MAXILLOFACIAL W/O CM dated 10/01/2013; CT HEAD W/O CM dated 11/12/2010; CT C SPINE W/O CM dated 11/12/2010  FINDINGS: CT HEAD FINDINGS  There is no evidence of mass effect, midline shift, or extra-axial fluid collections. There is no evidence of a space-occupying lesion or intracranial hemorrhage. There is no evidence of a cortical-based area of acute infarction. There is generalized cerebral atrophy. There is periventricular white matter low attenuation likely secondary to microangiopathy.  The ventricles and sulci are appropriate for the patient's age. The basal cisterns are patent.  Visualized portions of the orbits are unremarkable. Mild bilateral ethmoid sinus mucosal thickening. Cerebrovascular  atherosclerotic calcifications are noted.  The osseous structures are unremarkable.  CT CERVICAL SPINE FINDINGS  The alignment is anatomic. The vertebral body heights are maintained. There is no acute fracture. Minimal anterolisthesis of C4 on C5. The prevertebral soft tissues are normal. The intraspinal soft tissues are not fully imaged on this examination due to poor soft tissue contrast, but there is no gross soft tissue abnormality.  There is degenerative disc disease of the cervical spine most severe at C5-6 and C6-7. There are mild broad-based disc bulges at C5-6 and C6-7. There is a mild broad-based disc bulge at C3-4. There is bilateral facet arthropathy throughout the cervical spine. 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 There is bilateral foraminal stenosis at C3-4, C4-5, C5-6 and C6-7.  The visualized portions of the lung apices demonstrate no focal abnormality.There is bilateral carotid artery atherosclerosis.  IMPRESSION: 1. No acute intracranial pathology. 2. No acute osseous injury of the cervical spine.   Electronically Signed   By: Kathreen Devoid  On: 01/02/2014 00:35   Ct Cervical Spine Wo Contrast  01/02/2014   CLINICAL DATA:  Unwitnessed fall, chronic pain, limping on right leg  EXAM: CT HEAD WITHOUT CONTRAST  CT CERVICAL SPINE WITHOUT CONTRAST  TECHNIQUE: Multidetector CT imaging of the head and cervical spine was performed following the standard protocol without intravenous contrast. Multiplanar CT image reconstructions of the cervical spine were also generated.  COMPARISON:  CT HEAD W/O CM dated 10/01/2013; CT C SPINE W/O CM dated 10/01/2013; CT MAXILLOFACIAL W/O CM dated 10/01/2013; CT HEAD W/O CM dated 11/12/2010; CT C SPINE W/O CM dated 11/12/2010  FINDINGS: CT HEAD FINDINGS  There is no evidence of mass effect, midline shift, or extra-axial fluid collections. There is no evidence of a space-occupying lesion or intracranial hemorrhage. There is no evidence of a  cortical-based area of acute infarction. There is generalized cerebral atrophy. There is periventricular white matter low attenuation likely secondary to microangiopathy.  The ventricles and sulci are appropriate for the patient's age. The basal cisterns are patent.  Visualized portions of the orbits are unremarkable. Mild bilateral ethmoid sinus mucosal thickening. Cerebrovascular atherosclerotic calcifications are noted.  The osseous structures are unremarkable.  CT CERVICAL SPINE FINDINGS  The alignment is anatomic. The vertebral body heights are maintained. There is no acute fracture. Minimal anterolisthesis of C4 on C5. The prevertebral soft tissues are normal. The intraspinal soft tissues are not fully imaged on this examination due to poor soft tissue contrast, but there is no gross soft tissue abnormality.  There is degenerative disc disease of the cervical spine most severe at C5-6 and C6-7. There are mild broad-based disc bulges at C5-6 and C6-7. There is a mild broad-based disc bulge at C3-4. There is bilateral facet arthropathy throughout the cervical spine. 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 There is bilateral foraminal stenosis at C3-4, C4-5, C5-6 and C6-7.  The visualized portions of the lung apices demonstrate no focal abnormality.There is bilateral carotid artery atherosclerosis.  IMPRESSION: 1. No acute intracranial pathology. 2. No acute osseous injury of the cervical spine.   Electronically Signed   By: Kathreen Devoid   On: 01/02/2014 00:35     EKG Interpretation   Date/Time:  Wednesday January 01 2014 23:49:14 EDT Ventricular Rate:  63 PR Interval:  193 QRS Duration: 95 QT Interval:  410 QTC Calculation: 420 R Axis:   25 Text Interpretation:  Sinus rhythm Ventricular premature complex Low  voltage, precordial leads Abnormal R-wave progression, early transition No  significant change since last tracing Confirmed by HORTON  MD, Loma Sousa  ([id number]) on 01/02/2014  12:37:38 AM      MDM   Final diagnoses:  Fall  Skin tear of elbow without complication   Patient presents following an unwitnessed fall. Nontoxic and at her baseline per patient's family. Skin tear of the right elbow and pain with range of motion of the right hip. Urinalysis shows no evidence of UTI. EKG is nonischemic and without evidence of arrhythmia. Imaging is reassuring. Discussed with the family. Patient will be discharged back to her living facility.  After history, exam, and medical workup I feel the patient has been appropriately medically screened and is safe for discharge home. Pertinent diagnoses were discussed with the patient. Patient was given return precautions.   Merryl Hacker, MD 01/02/14 [id number]

## 2014-03-11 ENCOUNTER — Inpatient Hospital Stay (HOSPITAL_COMMUNITY): Payer: Medicare Other

## 2014-03-11 ENCOUNTER — Emergency Department (HOSPITAL_COMMUNITY): Payer: Medicare Other

## 2014-03-11 ENCOUNTER — Inpatient Hospital Stay (HOSPITAL_COMMUNITY)
Admission: EM | Admit: 2014-03-11 | Discharge: 2014-03-14 | DRG: 482 | Disposition: A | Discharge status: Skilled Nursing Facility | Payer: Medicare Other | Attending: Internal Medicine | Admitting: Internal Medicine

## 2014-03-11 ENCOUNTER — Encounter (HOSPITAL_COMMUNITY): Payer: Self-pay | Admitting: Emergency Medicine

## 2014-03-11 ENCOUNTER — Inpatient Hospital Stay (HOSPITAL_COMMUNITY): Payer: Medicare Other | Admitting: Anesthesiology

## 2014-03-11 ENCOUNTER — Encounter (HOSPITAL_COMMUNITY): Payer: Medicare Other | Admitting: Anesthesiology

## 2014-03-11 ENCOUNTER — Encounter (HOSPITAL_COMMUNITY)
Admission: EM | Disposition: A | Discharge status: Skilled Nursing Facility | Payer: Self-pay | Source: Home / Self Care | Attending: Internal Medicine

## 2014-03-11 DIAGNOSIS — Z888 Allergy status to other drugs, medicaments and biological substances status: Secondary | ICD-10-CM

## 2014-03-11 DIAGNOSIS — F0390 Unspecified dementia without behavioral disturbance: Secondary | ICD-10-CM | POA: Diagnosis present

## 2014-03-11 DIAGNOSIS — Z7901 Long term (current) use of anticoagulants: Secondary | ICD-10-CM

## 2014-03-11 DIAGNOSIS — W19XXXA Unspecified fall, initial encounter: Secondary | ICD-10-CM | POA: Diagnosis present

## 2014-03-11 DIAGNOSIS — D510 Vitamin B12 deficiency anemia due to intrinsic factor deficiency: Secondary | ICD-10-CM | POA: Diagnosis present

## 2014-03-11 DIAGNOSIS — R1310 Dysphagia, unspecified: Secondary | ICD-10-CM | POA: Diagnosis present

## 2014-03-11 DIAGNOSIS — E039 Hypothyroidism, unspecified: Secondary | ICD-10-CM | POA: Diagnosis present

## 2014-03-11 DIAGNOSIS — Z8249 Family history of ischemic heart disease and other diseases of the circulatory system: Secondary | ICD-10-CM

## 2014-03-11 DIAGNOSIS — Z96649 Presence of unspecified artificial hip joint: Secondary | ICD-10-CM

## 2014-03-11 DIAGNOSIS — S72142A Displaced intertrochanteric fracture of left femur, initial encounter for closed fracture: Secondary | ICD-10-CM

## 2014-03-11 DIAGNOSIS — K5900 Constipation, unspecified: Secondary | ICD-10-CM | POA: Diagnosis present

## 2014-03-11 DIAGNOSIS — Z951 Presence of aortocoronary bypass graft: Secondary | ICD-10-CM

## 2014-03-11 DIAGNOSIS — Z66 Do not resuscitate: Secondary | ICD-10-CM | POA: Diagnosis present

## 2014-03-11 DIAGNOSIS — I2510 Atherosclerotic heart disease of native coronary artery without angina pectoris: Secondary | ICD-10-CM

## 2014-03-11 DIAGNOSIS — E785 Hyperlipidemia, unspecified: Secondary | ICD-10-CM | POA: Diagnosis present

## 2014-03-11 DIAGNOSIS — I10 Essential (primary) hypertension: Secondary | ICD-10-CM | POA: Diagnosis present

## 2014-03-11 DIAGNOSIS — S72143A Displaced intertrochanteric fracture of unspecified femur, initial encounter for closed fracture: Principal | ICD-10-CM

## 2014-03-11 DIAGNOSIS — S72002A Fracture of unspecified part of neck of left femur, initial encounter for closed fracture: Secondary | ICD-10-CM

## 2014-03-11 DIAGNOSIS — K219 Gastro-esophageal reflux disease without esophagitis: Secondary | ICD-10-CM | POA: Diagnosis present

## 2014-03-11 DIAGNOSIS — F0391 Unspecified dementia with behavioral disturbance: Secondary | ICD-10-CM

## 2014-03-11 DIAGNOSIS — E876 Hypokalemia: Secondary | ICD-10-CM | POA: Diagnosis present

## 2014-03-11 DIAGNOSIS — F03918 Unspecified dementia, unspecified severity, with other behavioral disturbance: Secondary | ICD-10-CM

## 2014-03-11 DIAGNOSIS — Z79899 Other long term (current) drug therapy: Secondary | ICD-10-CM

## 2014-03-11 DIAGNOSIS — Y9210 Unspecified residential institution as the place of occurrence of the external cause: Secondary | ICD-10-CM | POA: Diagnosis present

## 2014-03-11 HISTORY — DX: Other specified postprocedural states: Z98.890

## 2014-03-11 HISTORY — DX: Personal history of peptic ulcer disease: Z87.11

## 2014-03-11 HISTORY — DX: Dementia in other diseases classified elsewhere, unspecified severity, without behavioral disturbance, psychotic disturbance, mood disturbance, and anxiety: F02.80

## 2014-03-11 HISTORY — DX: Malignant neoplasm of bladder, unspecified: C67.9

## 2014-03-11 HISTORY — DX: Alzheimer's disease, unspecified: G30.9

## 2014-03-11 HISTORY — DX: Other complications of anesthesia, initial encounter: T88.59XA

## 2014-03-11 HISTORY — DX: Hyperlipidemia, unspecified: E78.5

## 2014-03-11 HISTORY — PX: INTRAMEDULLARY (IM) NAIL INTERTROCHANTERIC: SHX5875

## 2014-03-11 HISTORY — DX: Fracture of unspecified part of neck of left femur, initial encounter for closed fracture: S72.002A

## 2014-03-11 HISTORY — DX: Essential (primary) hypertension: I10

## 2014-03-11 HISTORY — DX: Adverse effect of unspecified anesthetic, initial encounter: T41.45XA

## 2014-03-11 HISTORY — DX: Gastro-esophageal reflux disease without esophagitis: K21.9

## 2014-03-11 HISTORY — DX: Nausea with vomiting, unspecified: R11.2

## 2014-03-11 HISTORY — DX: Constipation, unspecified: K59.00

## 2014-03-11 HISTORY — DX: Atherosclerotic heart disease of native coronary artery without angina pectoris: I25.10

## 2014-03-11 HISTORY — DX: Hypothyroidism, unspecified: E03.9

## 2014-03-11 HISTORY — DX: Personal history of other diseases of the digestive system: Z87.19

## 2014-03-11 HISTORY — DX: Unspecified osteoarthritis, unspecified site: M19.90

## 2014-03-11 LAB — CBC
HCT: 31.9 % — ABNORMAL LOW (ref 36.0–46.0)
HEMOGLOBIN: 10.5 g/dL — AB (ref 12.0–15.0)
MCH: 29.3 pg (ref 26.0–34.0)
MCHC: 32.9 g/dL (ref 30.0–36.0)
MCV: 89.1 fL (ref 78.0–100.0)
PLATELETS: 270 10*3/uL (ref 150–400)
RBC: 3.58 MIL/uL — AB (ref 3.87–5.11)
RDW: 14.2 % (ref 11.5–15.5)
WBC: 13.4 10*3/uL — ABNORMAL HIGH (ref 4.0–10.5)

## 2014-03-11 LAB — I-STAT CHEM 8, ED
BUN: 14 mg/dL (ref 6–23)
CHLORIDE: 99 meq/L (ref 96–112)
Calcium, Ion: 1.22 mmol/L (ref 1.13–1.30)
Creatinine, Ser: 0.9 mg/dL (ref 0.50–1.10)
Glucose, Bld: 120 mg/dL — ABNORMAL HIGH (ref 70–99)
HEMATOCRIT: 37 % (ref 36.0–46.0)
Hemoglobin: 12.6 g/dL (ref 12.0–15.0)
POTASSIUM: 3.6 meq/L — AB (ref 3.7–5.3)
Sodium: 138 mEq/L (ref 137–147)
TCO2: 25 mmol/L (ref 0–100)

## 2014-03-11 LAB — SURGICAL PCR SCREEN
MRSA, PCR: NEGATIVE
Staphylococcus aureus: POSITIVE — AB

## 2014-03-11 LAB — PROTIME-INR
INR: 1.14 (ref 0.00–1.49)
PROTHROMBIN TIME: 14.4 s (ref 11.6–15.2)

## 2014-03-11 LAB — TYPE AND SCREEN
ABO/RH(D): A NEG
ANTIBODY SCREEN: NEGATIVE

## 2014-03-11 LAB — ABO/RH: ABO/RH(D): A NEG

## 2014-03-11 SURGERY — FIXATION, FRACTURE, INTERTROCHANTERIC, WITH INTRAMEDULLARY ROD
Anesthesia: General | Site: Hip | Laterality: Left

## 2014-03-11 MED ORDER — SENNA 8.6 MG PO TABS
1.0000 | ORAL_TABLET | Freq: Two times a day (BID) | ORAL | Status: DC
  Administered 2014-03-12 – 2014-03-14 (×5): 8.6 mg via ORAL
  Filled 2014-03-11 (×6): qty 1

## 2014-03-11 MED ORDER — MORPHINE SULFATE 2 MG/ML IJ SOLN
0.5000 mg | INTRAMUSCULAR | Status: DC | PRN
  Filled 2014-03-11: qty 1

## 2014-03-11 MED ORDER — LIDOCAINE HCL (CARDIAC) 20 MG/ML IV SOLN
INTRAVENOUS | Status: AC
  Filled 2014-03-11: qty 5

## 2014-03-11 MED ORDER — ETOMIDATE 2 MG/ML IV SOLN
INTRAVENOUS | Status: DC | PRN
  Administered 2014-03-11: 12 mg via INTRAVENOUS

## 2014-03-11 MED ORDER — CLINDAMYCIN PHOSPHATE 600 MG/50ML IV SOLN
INTRAVENOUS | Status: AC
  Administered 2014-03-11: 600 mg via INTRAVENOUS
  Filled 2014-03-11: qty 50

## 2014-03-11 MED ORDER — MORPHINE SULFATE 15 MG PO TABS: 15.0000 mg | ORAL_TABLET | ORAL | Status: DC | PRN

## 2014-03-11 MED ORDER — GLYCOPYRROLATE 0.2 MG/ML IJ SOLN
INTRAMUSCULAR | Status: AC
  Filled 2014-03-11: qty 2

## 2014-03-11 MED ORDER — DEXTROSE 5 % IV SOLN
INTRAVENOUS | Status: DC | PRN
  Administered 2014-03-11 (×2): via INTRAVENOUS

## 2014-03-11 MED ORDER — KCL IN DEXTROSE-NACL 20-5-0.45 MEQ/L-%-% IV SOLN
INTRAVENOUS | Status: DC
  Filled 2014-03-11 (×5): qty 1000

## 2014-03-11 MED ORDER — CEFAZOLIN SODIUM 1-5 GM-% IV SOLN
1.0000 g | INTRAVENOUS | Status: AC
  Administered 2014-03-11: 1 g via INTRAVENOUS
  Filled 2014-03-11 (×2): qty 50

## 2014-03-11 MED ORDER — ONDANSETRON HCL 4 MG/2ML IJ SOLN
INTRAMUSCULAR | Status: DC | PRN
  Administered 2014-03-11: 4 mg via INTRAVENOUS

## 2014-03-11 MED ORDER — ONDANSETRON HCL 4 MG/2ML IJ SOLN
INTRAMUSCULAR | Status: AC
  Filled 2014-03-11: qty 2

## 2014-03-11 MED ORDER — ETOMIDATE 2 MG/ML IV SOLN
INTRAVENOUS | Status: AC
  Filled 2014-03-11: qty 10

## 2014-03-11 MED ORDER — ROCURONIUM BROMIDE 50 MG/5ML IV SOLN
INTRAVENOUS | Status: AC
  Filled 2014-03-11: qty 1

## 2014-03-11 MED ORDER — PROPOFOL 10 MG/ML IV BOLUS
INTRAVENOUS | Status: AC
  Filled 2014-03-11: qty 20

## 2014-03-11 MED ORDER — METOPROLOL TARTRATE 12.5 MG HALF TABLET
12.5000 mg | ORAL_TABLET | Freq: Two times a day (BID) | ORAL | Status: DC
  Administered 2014-03-12 – 2014-03-14 (×5): 12.5 mg via ORAL
  Filled 2014-03-11 (×6): qty 1

## 2014-03-11 MED ORDER — GLYCOPYRROLATE 0.2 MG/ML IJ SOLN
INTRAMUSCULAR | Status: DC | PRN
  Administered 2014-03-11: .5 mg via INTRAVENOUS

## 2014-03-11 MED ORDER — NEOSTIGMINE METHYLSULFATE 10 MG/10ML IV SOLN
INTRAVENOUS | Status: DC | PRN
  Administered 2014-03-11: 3 mg via INTRAVENOUS

## 2014-03-11 MED ORDER — POLYETHYLENE GLYCOL 3350 17 G PO PACK
17.0000 g | PACK | Freq: Every day | ORAL | Status: DC
Start: 2014-03-12 — End: 2014-03-14
  Administered 2014-03-12 – 2014-03-14 (×3): 17 g via ORAL
  Filled 2014-03-11 (×4): qty 1

## 2014-03-11 MED ORDER — LIDOCAINE HCL (CARDIAC) 20 MG/ML IV SOLN
INTRAVENOUS | Status: DC | PRN
  Administered 2014-03-11: 100 mg via INTRAVENOUS

## 2014-03-11 MED ORDER — LACTATED RINGERS IV SOLN
INTRAVENOUS | Status: DC | PRN
Start: 2014-03-11 — End: 2014-03-11
  Administered 2014-03-11: 21:00:00 via INTRAVENOUS

## 2014-03-11 MED ORDER — TRAMADOL HCL 50 MG PO TABS: 50.0000 mg | ORAL_TABLET | Freq: Three times a day (TID) | ORAL | Status: DC | PRN

## 2014-03-11 MED ORDER — EPHEDRINE SULFATE 50 MG/ML IJ SOLN
INTRAMUSCULAR | Status: AC
Start: 2014-03-11 — End: 2014-03-11
  Filled 2014-03-11: qty 1

## 2014-03-11 MED ORDER — SUCCINYLCHOLINE CHLORIDE 20 MG/ML IJ SOLN
INTRAMUSCULAR | Status: DC | PRN
  Administered 2014-03-11: 100 mg via INTRAVENOUS

## 2014-03-11 MED ORDER — VITAMIN B-12 1000 MCG PO TABS
1000.0000 ug | ORAL_TABLET | Freq: Every day | ORAL | Status: DC
  Administered 2014-03-12 – 2014-03-14 (×3): 1000 ug via ORAL
  Filled 2014-03-11 (×3): qty 1

## 2014-03-11 MED ORDER — CHLORHEXIDINE GLUCONATE 4 % EX LIQD
60.0000 mL | Freq: Once | CUTANEOUS | Status: DC
  Filled 2014-03-11: qty 60

## 2014-03-11 MED ORDER — PHENYLEPHRINE 40 MCG/ML (10ML) SYRINGE FOR IV PUSH (FOR BLOOD PRESSURE SUPPORT)
PREFILLED_SYRINGE | INTRAVENOUS | Status: AC
  Filled 2014-03-11: qty 10

## 2014-03-11 MED ORDER — CALCIUM CARBONATE-VITAMIN D 600-200 MG-UNIT PO TABS: 1.0000 | ORAL_TABLET | Freq: Two times a day (BID) | ORAL | Status: DC

## 2014-03-11 MED ORDER — 0.9 % SODIUM CHLORIDE (POUR BTL) OPTIME
TOPICAL | Status: DC | PRN
  Administered 2014-03-11: 300 mL

## 2014-03-11 MED ORDER — ROCURONIUM BROMIDE 100 MG/10ML IV SOLN
INTRAVENOUS | Status: DC | PRN
  Administered 2014-03-11: 20 mg via INTRAVENOUS

## 2014-03-11 MED ORDER — SODIUM CHLORIDE 0.9 % IJ SOLN
INTRAMUSCULAR | Status: AC
  Filled 2014-03-11: qty 10

## 2014-03-11 MED ORDER — ONDANSETRON HCL 4 MG/2ML IJ SOLN: 4.0000 mg | Freq: Three times a day (TID) | INTRAMUSCULAR | Status: AC | PRN

## 2014-03-11 MED ORDER — CALCIUM CARBONATE-VITAMIN D 500-200 MG-UNIT PO TABS
1.0000 | ORAL_TABLET | Freq: Two times a day (BID) | ORAL | Status: DC
  Administered 2014-03-12 – 2014-03-14 (×5): 1 via ORAL
  Filled 2014-03-11 (×6): qty 1

## 2014-03-11 MED ORDER — FENTANYL CITRATE 0.05 MG/ML IJ SOLN
INTRAMUSCULAR | Status: DC | PRN
  Administered 2014-03-11 (×2): 50 ug via INTRAVENOUS

## 2014-03-11 MED ORDER — VITAMIN E 180 MG (400 UNIT) PO CAPS
400.0000 [IU] | ORAL_CAPSULE | Freq: Two times a day (BID) | ORAL | Status: DC
  Administered 2014-03-12 – 2014-03-14 (×4): 400 [IU] via ORAL
  Filled 2014-03-11 (×7): qty 1

## 2014-03-11 MED ORDER — SUCCINYLCHOLINE CHLORIDE 20 MG/ML IJ SOLN
INTRAMUSCULAR | Status: AC
  Filled 2014-03-11: qty 1

## 2014-03-11 MED ORDER — BISACODYL 5 MG PO TBEC: 5.0000 mg | DELAYED_RELEASE_TABLET | Freq: Every day | ORAL | Status: DC | PRN

## 2014-03-11 MED ORDER — NEOSTIGMINE METHYLSULFATE 10 MG/10ML IV SOLN
INTRAVENOUS | Status: AC
  Filled 2014-03-11: qty 1

## 2014-03-11 MED ORDER — DOCUSATE SODIUM 100 MG PO CAPS
100.0000 mg | ORAL_CAPSULE | Freq: Two times a day (BID) | ORAL | Status: DC
  Administered 2014-03-12 – 2014-03-14 (×4): 100 mg via ORAL
  Filled 2014-03-11 (×4): qty 1

## 2014-03-11 MED ORDER — ENOXAPARIN SODIUM 300 MG/3ML IJ SOLN: 30.0000 mg | Freq: Every day | INTRAMUSCULAR | Status: DC

## 2014-03-11 MED ORDER — LEVOTHYROXINE SODIUM 25 MCG PO TABS
25.0000 ug | ORAL_TABLET | Freq: Every day | ORAL | Status: DC
  Administered 2014-03-12 – 2014-03-14 (×3): 25 ug via ORAL
  Filled 2014-03-11 (×4): qty 1

## 2014-03-11 MED ORDER — GLYCOPYRROLATE 0.2 MG/ML IJ SOLN
INTRAMUSCULAR | Status: AC
  Filled 2014-03-11: qty 1

## 2014-03-11 MED ORDER — FENTANYL CITRATE 0.05 MG/ML IJ SOLN
INTRAMUSCULAR | Status: AC
  Filled 2014-03-11: qty 5

## 2014-03-11 MED ORDER — EPHEDRINE SULFATE 50 MG/ML IJ SOLN
INTRAMUSCULAR | Status: DC | PRN
  Administered 2014-03-11: 10 mg via INTRAVENOUS
  Administered 2014-03-11: 5 mg via INTRAVENOUS

## 2014-03-11 MED ORDER — ACETAMINOPHEN 500 MG PO TABS: 1000.0000 mg | ORAL_TABLET | Freq: Three times a day (TID) | ORAL | Status: DC | PRN

## 2014-03-11 MED ORDER — MORPHINE SULFATE 2 MG/ML IJ SOLN: 0.5000 mg | INTRAMUSCULAR | Status: DC | PRN

## 2014-03-11 SURGICAL SUPPLY — 53 items
BANDAGE GAUZE ELAST BULKY 4 IN (GAUZE/BANDAGES/DRESSINGS) ×3 IMPLANT
BIT DRILL CANN LG 4.3MM (BIT) IMPLANT
BLADE SURG 15 STRL LF DISP TIS (BLADE) ×1 IMPLANT
BLADE SURG 15 STRL SS (BLADE) ×3
BNDG COHESIVE 4X5 TAN NS LF (GAUZE/BANDAGES/DRESSINGS) ×3 IMPLANT
COVER PERINEAL POST (MISCELLANEOUS) ×3 IMPLANT
COVER SURGICAL LIGHT HANDLE (MISCELLANEOUS) ×3 IMPLANT
DRAPE PROXIMA HALF (DRAPES) IMPLANT
DRAPE STERI IOBAN 125X83 (DRAPES) ×1 IMPLANT
DRILL BIT CANN LG 4.3MM (BIT) ×3
DRSG AQUACEL AG ADV 3.5X10 (GAUZE/BANDAGES/DRESSINGS) ×2 IMPLANT
DRSG MEPILEX BORDER 4X4 (GAUZE/BANDAGES/DRESSINGS) ×2 IMPLANT
DRSG MEPILEX BORDER 4X8 (GAUZE/BANDAGES/DRESSINGS) ×1 IMPLANT
DRSG PAD ABDOMINAL 8X10 ST (GAUZE/BANDAGES/DRESSINGS) ×6 IMPLANT
DURAPREP 26ML APPLICATOR (WOUND CARE) ×3 IMPLANT
ELECT REM PT RETURN 9FT ADLT (ELECTROSURGICAL) ×3
ELECTRODE REM PT RTRN 9FT ADLT (ELECTROSURGICAL) ×1 IMPLANT
FACESHIELD WRAPAROUND (MASK) IMPLANT
FACESHIELD WRAPAROUND OR TEAM (MASK) ×1 IMPLANT
GAUZE XEROFORM 5X9 LF (GAUZE/BANDAGES/DRESSINGS) ×1 IMPLANT
GLOVE BIO SURGEON STRL SZ8 (GLOVE) ×3 IMPLANT
GLOVE BIOGEL PI IND STRL 7.5 (GLOVE) IMPLANT
GLOVE BIOGEL PI IND STRL 8 (GLOVE) ×1 IMPLANT
GLOVE BIOGEL PI INDICATOR 7.5 (GLOVE) ×4
GLOVE BIOGEL PI INDICATOR 8 (GLOVE) ×2
GLOVE ORTHO TXT STRL SZ7.5 (GLOVE) ×3 IMPLANT
GOWN STRL REUS W/ TWL LRG LVL3 (GOWN DISPOSABLE) ×1 IMPLANT
GOWN STRL REUS W/ TWL XL LVL3 (GOWN DISPOSABLE) ×2 IMPLANT
GOWN STRL REUS W/TWL LRG LVL3 (GOWN DISPOSABLE) ×3
GOWN STRL REUS W/TWL XL LVL3 (GOWN DISPOSABLE) ×6
KIT BASIN OR (CUSTOM PROCEDURE TRAY) ×3 IMPLANT
KIT ROOM TURNOVER OR (KITS) ×3 IMPLANT
LINER BOOT UNIVERSAL DISP (MISCELLANEOUS) ×3 IMPLANT
MANIFOLD NEPTUNE II (INSTRUMENTS) ×1 IMPLANT
NAIL HIP FRACT 130D 11X180 (Screw) ×2 IMPLANT
NS IRRIG 1000ML POUR BTL (IV SOLUTION) ×3 IMPLANT
PACK GENERAL/GYN (CUSTOM PROCEDURE TRAY) ×1 IMPLANT
PAD ARMBOARD 7.5X6 YLW CONV (MISCELLANEOUS) ×8 IMPLANT
PAD CAST 4YDX4 CTTN HI CHSV (CAST SUPPLIES) ×2 IMPLANT
PADDING CAST COTTON 4X4 STRL (CAST SUPPLIES)
PIN GUIDE 3.2 903003004 (MISCELLANEOUS) ×6 IMPLANT
SCREW ANTI-ROTATION 75MM (Screw) ×2 IMPLANT
SCREW BONE CORTICAL 5.0X3 (Screw) ×2 IMPLANT
SCREW DRILL BIT ANIT ROTATION (BIT) ×2 IMPLANT
SCREW LAG HIP NAIL 10.5X95 (Screw) ×2 IMPLANT
STAPLER VISISTAT 35W (STAPLE) ×3 IMPLANT
SUT VIC AB 0 CT1 27 (SUTURE) ×3
SUT VIC AB 0 CT1 27XBRD ANBCTR (SUTURE) ×2 IMPLANT
SUT VIC AB 2-0 CT1 27 (SUTURE) ×3
SUT VIC AB 2-0 CT1 TAPERPNT 27 (SUTURE) ×2 IMPLANT
TOWEL OR 17X24 6PK STRL BLUE (TOWEL DISPOSABLE) ×3 IMPLANT
TOWEL OR 17X26 10 PK STRL BLUE (TOWEL DISPOSABLE) ×3 IMPLANT
WATER STERILE IRR 1000ML POUR (IV SOLUTION) ×3 IMPLANT

## 2014-03-11 NOTE — ED Notes (Signed)
Attempted report 

## 2014-03-11 NOTE — Interval H&P Note (Signed)
History and Physical Interval Note:  03/11/2014 7:59 PM  Heather [name]  has presented today for surgery, with the diagnosis of LEFT INTERTROCH FRACTURE  The various methods of treatment have been discussed with the patient and family. After consideration of risks, benefits and other options for treatment, the patient has consented to  Procedure(s): INTRAMEDULLARY (IM) NAIL INTERTROCHANTRIC (Left) as a surgical intervention .  The patient's history has been reviewed, patient examined, no change in status, stable for surgery.  I have reviewed the patient's chart and labs.  Questions were answered to the patient's satisfaction.     BEANE,JEFFREY C

## 2014-03-11 NOTE — Transfer of Care (Signed)
Immediate Anesthesia Transfer of Care Note  Patient: Heather [name]  Procedure(s) Performed: Procedure(s): INTRAMEDULLARY (IM) NAIL INTERTROCHANTRIC (Left)  Patient Location: PACU  Anesthesia Type:General  Level of Consciousness: awake  Airway & Oxygen Therapy: Patient Spontanous Breathing and Patient connected to nasal cannula oxygen  Post-op Assessment: Report given to PACU RN and Post -op Vital signs reviewed and stable  Post vital signs: Reviewed and stable  Complications: No apparent anesthesia complications

## 2014-03-11 NOTE — Consult Note (Signed)
Reason for Consult: Left hip pain Referring Physician: EDP  Heather [name] is an 80 y.o. female.  HPI: [place] on hip. Has pain.  Past Medical History  Diagnosis Date  . Dementia   . Fracture closed, humerus   . Lumbar compression fracture   . CAD (coronary artery disease)   . Hypertension   . Hyperlipidemia   . Hypothyroidism   . Constipation     Past Surgical History  Procedure Laterality Date  . Coronary artery bypass graft  1995  . Hip surgery Right     Replacement    Family History  Problem Relation Age of Onset  . Heart disease Brother   . Heart disease Brother   . Heart disease Sister     Social History:  reports that she has never smoked. She does not have any smokeless tobacco history on file. She reports that she does not drink alcohol or use illicit drugs.  Allergies:  Allergies  Allergen Reactions  . Codeine     Makes her "mean and crazy"    Medications: Reviewed  Results for orders placed during the hospital encounter of 03/11/14 (from the past 48 hour(s))  PROTIME-INR     Status: None   Collection Time    03/11/14  2:28 PM      Result Value Ref Range   Prothrombin Time 14.4  11.6 - 15.2 seconds   INR 1.14  0.00 - 1.49  I-STAT CHEM 8, ED     Status: Abnormal   Collection Time    03/11/14  2:35 PM      Result Value Ref Range   Sodium 138  137 - 147 mEq/L   Potassium 3.6 (*) 3.7 - 5.3 mEq/L   Chloride 99  96 - 112 mEq/L   BUN 14  6 - 23 mg/dL   Creatinine, Ser 0.90  0.50 - 1.10 mg/dL   Glucose, Bld 120 (*) 70 - 99 mg/dL   Calcium, Ion 1.22  1.13 - 1.30 mmol/L   TCO2 25  0 - 100 mmol/L   Hemoglobin 12.6  12.0 - 15.0 g/dL   HCT 37.0  36.0 - 46.0 %    Dg Chest 1 View  03/11/2014   CLINICAL DATA:  Fall.  Hip pain.  EXAM: CHEST - 1 VIEW  COMPARISON:  06/14/2011 chest radiograph.  FINDINGS: Low volume chest. Low volumes accentuate the cardiopericardial silhouette. Mediastinal contours appear within normal limits for volumes of inspiration.  Postoperative changes of CABG. Prominent skin fold is present along the RIGHT lateral RIGHT chest. No airspace disease or effusion. Nonspecific diffuse interstitial prominence is probably age related and is accentuated by the low volumes.  Severe bilateral glenohumeral osteoarthritis is present with an erosion in the medial LEFT humeral neck. Erosion appears new compared to the prior exam.  IMPRESSION: Low volume chest.  No gross acute cardiopulmonary disease.   Electronically Signed   By: Dereck Ligas M.D.   On: 03/11/2014 16:20   Dg Hip Complete Left  03/11/2014   CLINICAL DATA:  Fall.  LEFT hip pain.  EXAM: LEFT HIP - COMPLETE 2+ VIEW  COMPARISON:  11/12/2010.  FINDINGS: Intertrochanteric LEFT femur fracture is new compared to the prior radiograph. The fracture is comminuted and moderately displaced. The LEFT femoral head remains located. RIGHT total hip arthroplasty is present. Osteopenia of the pelvic bones. No displaced pelvic ring fracture.  IMPRESSION: New comminuted intertrochanteric LEFT femur fracture.   Electronically Signed   By: Dereck Ligas  M.D.   On: 03/11/2014 16:17    Review of Systems  Musculoskeletal: Positive for joint pain.  All other systems reviewed and are negative.  Blood pressure 156/59, pulse 67, temperature 97.4 F (36.3 C), temperature source Oral, resp. rate 18, weight 53.162 kg (117 lb 3.2 oz), SpO2 100.00%. Physical Exam  Constitutional: She appears distressed.  HENT:  Head: Normocephalic.  Eyes: Pupils are equal, round, and reactive to light.  Neck: Normal range of motion.  Cardiovascular: Normal rate.   Respiratory: Effort normal.  GI: Soft.  Musculoskeletal:  Left hip pain with ROM NVI. No DVT.  Neurological: She is alert.  Skin: Skin is warm and dry.  Psychiatric: She has a normal mood and affect.    Assessment/Plan: Left intertrochanteric hip fracture. Plan IM nailing.  Risks discussed.   BEANE,JEFFREY C 03/11/2014, 6:52 PM

## 2014-03-11 NOTE — ED Notes (Signed)
Per EMS, Patient is from [facility]. Patient was coming out of the dining place. Patient hit a threshold and lost grip of her walker. The fall was witnessed and witnesses state the patient fell on the right hip. Patient placed in C-Collar during assessment for precautions. Denies hitting her head. Patient is asleep upon arrival. EMS gave patient 100 mcg of Fentanyl. Vitals per 119/58, 68 HR.

## 2014-03-11 NOTE — Anesthesia Postprocedure Evaluation (Signed)
  Anesthesia Post-op Note  Patient: Heather [name]  Procedure(s) Performed: Procedure(s): INTRAMEDULLARY (IM) NAIL INTERTROCHANTRIC (Left)  Patient Location: PACU  Anesthesia Type:General  Level of Consciousness: awake  Airway and Oxygen Therapy: Patient Spontanous Breathing  Post-op Pain: mild  Post-op Assessment: Post-op Vital signs reviewed  Post-op Vital Signs: Reviewed  Last Vitals:  Filed Vitals:   03/11/14 2300  BP: 130/59  Pulse: 73  Temp:   Resp: 16    Complications: No apparent anesthesia complications

## 2014-03-11 NOTE — H&P (Signed)
[facility] History and Physical  ARALI SOMERA TKP:[id number] DOB: 25-Jul-1919 DOA: 03/11/2014  Referring physician:  Dr. Roxanne Mins PCP:  Horton Finer, MD   Chief Complaint:  Fall with left hip fracture  HPI:  The patient is a 80 y.o. year-old female with history of CAD s/p CABG, severe dementia, hypothyroidism, constipation who presents with fall.  The patient was last at their baseline health earlier today.  She lives in the [facility] green memory unit, and at baseline she is able to ambulate with a rolling walker, participate but not complete ADLs. She speaks gibberish but occasionally has some sensible phrases. Right after lunch, she was walking with her rolling walker and had an unwitnessed fall. She was unable to get up, so the facility called EMS who transported her to the emergency department. The patient is severely demented and not able to speak properly, but seems to be without pain at the present time.  Emergency department, vital signs were stable. Hemoglobin of 12.6, white blood cell count pending.  X-ray of the head notable for left intertrochanteric fracture. Chest x-ray without acute change. Orthopedics, Dr. Tonita Cong, has been paged however they have not called back yet.  Review of Systems:  Per son, she has been healthy and acting normally. He has not had report of any recent fevers, chills, signs of infection.   Past Medical History  Diagnosis Date  . Dementia   . Fracture closed, humerus   . Lumbar compression fracture   . CAD (coronary artery disease)   . Hypertension   . Hyperlipidemia   . Hypothyroidism   . Constipation    Past Surgical History  Procedure Laterality Date  . Coronary artery bypass graft  1995  . Hip surgery Right     Replacement   Social History:  reports that she has never smoked. She does not have any smokeless tobacco history on file. She reports that she does not drink alcohol or use illicit drugs. Lives at [facility] green memory  unit, ambulates with a rolling walker  Allergies  Allergen Reactions  . Codeine     Makes her "mean and crazy"    Family History  Problem Relation Age of Onset  . Heart disease Brother   . Heart disease Brother   . Heart disease Sister      Prior to Admission medications   Medication Sig Start Date End Date Taking? Authorizing Kaetlyn Noa  acetaminophen (TYLENOL) 500 MG tablet Take 1,000 mg by mouth every 6 (six) hours as needed for moderate pain.   Yes Historical Viona Hosking, MD  Calcium Carbonate-Vitamin D (CALCIUM 600+D) 600-200 MG-UNIT TABS Take 1 tablet by mouth 2 (two) times daily.   Yes Historical Azha Constantin, MD  docusate sodium (COLACE) 100 MG capsule Take 100 mg by mouth.   Yes Historical Avan Gullett, MD  hydrochlorothiazide (HYDRODIURIL) 25 MG tablet Take 25 mg by mouth daily.   Yes Historical Brendolyn Stockley, MD  levothyroxine (SYNTHROID, LEVOTHROID) 25 MCG tablet Take 25 mcg by mouth daily before breakfast.   Yes Historical Jong Rickman, MD  metoprolol tartrate (LOPRESSOR) 25 MG tablet Take 12.5 mg by mouth 2 (two) times daily.   Yes Historical Pyper Olexa, MD  vitamin B-12 (CYANOCOBALAMIN) 1000 MCG tablet Take 1,000 mcg by mouth daily.   Yes Historical Jourdin Gens, MD  vitamin E (VITAMIN E) 400 UNIT capsule Take 400 Units by mouth 2 (two) times daily.    Yes Historical Wise Fees, MD   Physical Exam: Filed Vitals:   03/11/14 1700 03/11/14 1730 03/11/14 1748  03/11/14 1800  BP: 129/60 127/55 127/55 114/57  Pulse: 68 65 74 67  Temp:      TempSrc:      Resp: 18 17 19 18   SpO2: 96% 100% 99% 97%     General:  Thin Caucasian female, no acute distress  Eyes:  PERRL, anicteric, non-injected.  ENT:  Nares clear.  OP clear, non-erythematous without plaques or exudates.  MMM.  Neck:  Supple without TM or JVD.    Lymph:  No cervical, supraclavicular, or submandibular LAD.  Cardiovascular:  RRR, normal S1, S2, without m/r/g.  2+ pulses, warm extremities  Respiratory:  CTA anteriorly without  increased WOB.  Abdomen:  NABS.  Soft, mildly distended, nontender.    Skin:  No rashes or focal lesions.  Musculoskeletal:  Normal bulk and tone.  No LE edema.  Left leg is approximately 2-3 inches shorter than the right and partly externally rotated.  Minimal bruising of the left lateral thigh. Mild tenderness to palpation of posterior lateral hip area  Psychiatric:  Alert but not oriented. Smiling and pleasant. Not able to speak with normal words  Neurologic:  CN 3-12 grossly intact.  4/5 strength bilateral upper and right lower extremity, left lower extremity. not tested.  Sensation intact.  Labs on Admission:  Basic Metabolic Panel:  Recent Labs Lab 03/11/14 1435  NA 138  K 3.6*  CL 99  GLUCOSE 120*  BUN 14  CREATININE 0.90   Liver Function Tests: No results found for this basename: AST, ALT, ALKPHOS, BILITOT, PROT, ALBUMIN,  in the last 168 hours No results found for this basename: LIPASE, AMYLASE,  in the last 168 hours No results found for this basename: AMMONIA,  in the last 168 hours CBC:  Recent Labs Lab 03/11/14 1435  HGB 12.6  HCT 37.0   Cardiac Enzymes: No results found for this basename: CKTOTAL, CKMB, CKMBINDEX, TROPONINI,  in the last 168 hours  BNP (last 3 results) No results found for this basename: PROBNP,  in the last 8760 hours CBG: No results found for this basename: GLUCAP,  in the last 168 hours  Radiological Exams on Admission: Dg Chest 1 View  03/11/2014   CLINICAL DATA:  Fall.  Hip pain.  EXAM: CHEST - 1 VIEW  COMPARISON:  06/14/2011 chest radiograph.  FINDINGS: Low volume chest. Low volumes accentuate the cardiopericardial silhouette. Mediastinal contours appear within normal limits for volumes of inspiration. Postoperative changes of CABG. Prominent skin fold is present along the RIGHT lateral RIGHT chest. No airspace disease or effusion. Nonspecific diffuse interstitial prominence is probably age related and is accentuated by the low  volumes.  Severe bilateral glenohumeral osteoarthritis is present with an erosion in the medial LEFT humeral neck. Erosion appears new compared to the prior exam.  IMPRESSION: Low volume chest.  No gross acute cardiopulmonary disease.   Electronically Signed   By: Dereck Ligas M.D.   On: 03/11/2014 16:20   Dg Hip Complete Left  03/11/2014   CLINICAL DATA:  Fall.  LEFT hip pain.  EXAM: LEFT HIP - COMPLETE 2+ VIEW  COMPARISON:  11/12/2010.  FINDINGS: Intertrochanteric LEFT femur fracture is new compared to the prior radiograph. The fracture is comminuted and moderately displaced. The LEFT femoral head remains located. RIGHT total hip arthroplasty is present. Osteopenia of the pelvic bones. No displaced pelvic ring fracture.  IMPRESSION: New comminuted intertrochanteric LEFT femur fracture.   Electronically Signed   By: Dereck Ligas M.D.   On: 03/11/2014 16:17  EKG: Independently reviewed.  NSR with trigeminy, Inverted T-waves in III, aVF, V1-V2.    Assessment/Plan Principal Problem:   Closed intertrochanteric fracture of left hip Active Problems:   CAD (coronary artery disease)   Severe dementia   Hypertension   Pernicious anemia  ---  Fall with left hip fracture -  DVT prophylaxis per orthopedics, will start SCDs for now -  MSIR with IV morphine for breakthrough pain -  Foley for comfort  -  Non-weight bearing -  Ortho consult pending -  Moderate risk for this urgent surgery.  Because she is demented, it is likely that she will have difficulty with atelectasis and participating with therapy postoperatively. Her son was advised that she will likely have a significant morbidity associated with this fracture despite surgery to correct and that there is increased mortality associated with hip fractures.  She is DNR/DNI and he stated he had had another family member who had a hip fracture who declined thereafter and died about a year later.  He would like her to be as comfortable as  possible.   -  Anesthesia:  Family requests that lower anesthesia doses be used if possible  -  F/u UA and Urine cx  Dementia, severe.  Has sundowning but family would like to avoid sedating medications if possible -  Falls precautions -  Frequent reorientation -  Please call granddaughter Lattie Haw who works nights at [employer] if she has severe agitation overnight  CAD s/p CABG -  Taken off plavix due to dementia and falls risk -  Not on statin due to attempts to minimize medications   HTN, stable -  Hold HCTZ  -  Continue metoprolol  Pernicious anemia, hgb stable.   -  Continue vitamin B12 supplements  Hypokalemia, mild.  Add potassium to IVF.  Diet:  Dysphagia 3 with thin, then NPO at MN Access:  PIV IVF:  yes Proph:  SCDs  Code Status: DNR/DNI Family Communication: son Disposition Plan: Admit to med-surg  Time spent: 60 min Janece Canterbury [facility] Pager [phone]  If 7PM-7AM, please contact night-coverage [email or web address] Password [facility] 03/11/2014, 6:21 PM

## 2014-03-11 NOTE — Brief Op Note (Signed)
03/11/2014  8:00 PM  PATIENT:  Heather [name]  80 y.o. female  PRE-OPERATIVE DIAGNOSIS:  LEFT INTERTROCH FRACTURE  POST-OPERATIVE DIAGNOSIS:  * No post-op diagnosis entered *  PROCEDURE:  Procedure(s): INTRAMEDULLARY (IM) NAIL INTERTROCHANTRIC (Left)  SURGEON:  Surgeon(s) and Role:    * Johnn Hai, MD - Primary  PHYSICIAN ASSISTANT:   ASSISTANTS: none   ANESTHESIA:   general  EBL:     BLOOD ADMINISTERED:none  DRAINS: none   LOCAL MEDICATIONS USED:  NONE  SPECIMEN:  No Specimen  DISPOSITION OF SPECIMEN:  N/A  COUNTS:  YES  TOURNIQUET:  * No tourniquets in log *  DICTATION: .Other Dictation: Dictation Number [id number]  PLAN OF CARE: Admit to inpatient   PATIENT DISPOSITION:  PACU - hemodynamically stable.   Delay start of Pharmacological VTE agent (>24hrs) due to surgical blood loss or risk of bleeding: no

## 2014-03-11 NOTE — Anesthesia Procedure Notes (Signed)
Procedure Name: Intubation Date/Time: 03/11/2014 9:15 PM Performed by: MCPHAIL, NANCY S Pre-anesthesia Checklist: Patient identified, Timeout performed, Emergency Drugs available, Suction available and Patient being monitored Patient Re-evaluated:Patient Re-evaluated prior to inductionOxygen Delivery Method: Circle system utilized Preoxygenation: Pre-oxygenation with 100% oxygen Intubation Type: IV induction, Rapid sequence and Cricoid Pressure applied Ventilation: Mask ventilation without difficulty Laryngoscope Size: Mac and 3 Grade View: Grade I Tube type: Oral Tube size: 7.0 mm Number of attempts: 1 Airway Equipment and Method: Stylet Placement Confirmation: ETT inserted through vocal cords under direct vision,  positive ETCO2 and breath sounds checked- equal and bilateral Secured at: 22 cm Tube secured with: Tape Dental Injury: Teeth and Oropharynx as per pre-operative assessment

## 2014-03-11 NOTE — ED Notes (Signed)
Dr. Audie Pinto at the bedside. Removed C-Collar.

## 2014-03-11 NOTE — ED Provider Notes (Signed)
Patient presented with a fall at assisted living facility and clinically had a fracture of the left hip. X-ray confirms intertrochanteric fracture-images viewed by me. Patient's son is in the room and I have explained what happened to him. Case is discussed with Dr. Sheran Fava of [facility] who agrees to admit the patient. Orthopedics will be consulted.  Delora Fuel, MD 48/18/59 0931

## 2014-03-11 NOTE — ED Notes (Signed)
Patient complains of left sided hip pain. Patient fell on her left side when she was exiting the cafeteria. Patient remains on the cardiac monitor. Patient resting.
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
## 2014-03-11 NOTE — ED Notes (Signed)
MD Roxanne Mins at bedside to update patient and family

## 2014-03-11 NOTE — ED Notes (Signed)
Phlebotomy at the bedside  

## 2014-03-11 NOTE — Anesthesia Preprocedure Evaluation (Addendum)
Anesthesia Evaluation  Patient identified by MRN, date of birth, ID band Patient confused  General Assessment Comment:Discussed case preop with son and granddaughter who was informed that patient may require post operative ventilation. Son and granddaughter understood and questions answered. CE  Reviewed: Allergy & Precautions, H&P , NPO status , Patient's Chart, lab work & pertinent test results, reviewed documented beta blocker date and time   History of Anesthesia Complications (+) PONV  Airway Mallampati: II      Dental   Pulmonary          Cardiovascular hypertension, + CAD and + CABG     Neuro/Psych    GI/Hepatic GERD-  ,  Endo/Other  Hypothyroidism   Renal/GU      Musculoskeletal   Abdominal   Peds  Hematology   Anesthesia Other Findings   Reproductive/Obstetrics                       Anesthesia Physical Anesthesia Plan  ASA: III  Anesthesia Plan: General   Post-op Pain Management:    Induction: Intravenous  Airway Management Planned: Oral ETT  Additional Equipment:   Intra-op Plan:   Post-operative Plan: Possible Post-op intubation/ventilation  Informed Consent: I have reviewed the patients History and Physical, chart, labs and discussed the procedure including the risks, benefits and alternatives for the proposed anesthesia with the patient or authorized representative who has indicated his/her understanding and acceptance.   Dental advisory given  Plan Discussed with: CRNA, Anesthesiologist and Surgeon  Anesthesia Plan Comments:        Anesthesia Quick Evaluation

## 2014-03-12 LAB — BASIC METABOLIC PANEL
BUN: 17 mg/dL (ref 6–23)
CHLORIDE: 98 meq/L (ref 96–112)
CO2: 19 mEq/L (ref 19–32)
Calcium: 9.2 mg/dL (ref 8.4–10.5)
Creatinine, Ser: 0.7 mg/dL (ref 0.50–1.10)
GFR, EST AFRICAN AMERICAN: 84 mL/min — AB (ref >90)
GFR, EST NON AFRICAN AMERICAN: 72 mL/min — AB (ref >90)
Glucose, Bld: 135 mg/dL — ABNORMAL HIGH (ref 70–99)
POTASSIUM: 3.6 meq/L — AB (ref 3.7–5.3)
SODIUM: 137 meq/L (ref 137–147)

## 2014-03-12 LAB — URINALYSIS, ROUTINE W REFLEX MICROSCOPIC
Bilirubin Urine: NEGATIVE
Glucose, UA: NEGATIVE mg/dL
Ketones, ur: NEGATIVE mg/dL
LEUKOCYTES UA: NEGATIVE
NITRITE: NEGATIVE
Protein, ur: NEGATIVE mg/dL
SPECIFIC GRAVITY, URINE: 1.028 (ref 1.005–1.030)
Urobilinogen, UA: 0.2 mg/dL (ref 0.0–1.0)
pH: 5.5 (ref 5.0–8.0)

## 2014-03-12 LAB — URINE MICROSCOPIC-ADD ON

## 2014-03-12 LAB — CBC
HCT: 31.3 % — ABNORMAL LOW (ref 36.0–46.0)
Hemoglobin: 10.3 g/dL — ABNORMAL LOW (ref 12.0–15.0)
MCH: 30 pg (ref 26.0–34.0)
MCHC: 32.9 g/dL (ref 30.0–36.0)
MCV: 91.3 fL (ref 78.0–100.0)
Platelets: 229 K/uL (ref 150–400)
RBC: 3.43 MIL/uL — ABNORMAL LOW (ref 3.87–5.11)
RDW: 14.4 % (ref 11.5–15.5)
WBC: 12.6 K/uL — ABNORMAL HIGH (ref 4.0–10.5)

## 2014-03-12 MED ORDER — ONDANSETRON HCL 4 MG PO TABS
4.0000 mg | ORAL_TABLET | Freq: Four times a day (QID) | ORAL | Status: DC | PRN
Start: 2014-03-12 — End: 2014-03-14

## 2014-03-12 MED ORDER — ONDANSETRON HCL 4 MG/2ML IJ SOLN: 4.0000 mg | Freq: Four times a day (QID) | INTRAMUSCULAR | Status: DC | PRN

## 2014-03-12 MED ORDER — KCL IN DEXTROSE-NACL 20-5-0.45 MEQ/L-%-% IV SOLN
INTRAVENOUS | Status: AC
  Administered 2014-03-12: 01:00:00 via INTRAVENOUS
  Filled 2014-03-12 (×2): qty 1000

## 2014-03-12 MED ORDER — MORPHINE SULFATE 2 MG/ML IJ SOLN
0.5000 mg | INTRAMUSCULAR | Status: DC | PRN
  Administered 2014-03-12: 0.5 mg via INTRAVENOUS

## 2014-03-12 MED ORDER — MUPIROCIN 2 % EX OINT
1.0000 "application " | TOPICAL_OINTMENT | Freq: Two times a day (BID) | CUTANEOUS | Status: DC
  Administered 2014-03-12 – 2014-03-14 (×6): 1 via NASAL
  Filled 2014-03-12 (×2): qty 22

## 2014-03-12 MED ORDER — METOCLOPRAMIDE HCL 5 MG/ML IJ SOLN: 5.0000 mg | Freq: Three times a day (TID) | INTRAMUSCULAR | Status: DC | PRN

## 2014-03-12 MED ORDER — MENTHOL 3 MG MT LOZG: 1.0000 | LOZENGE | OROMUCOSAL | Status: DC | PRN

## 2014-03-12 MED ORDER — ENOXAPARIN SODIUM 30 MG/0.3ML ~~LOC~~ SOLN
30.0000 mg | SUBCUTANEOUS | Status: DC
  Administered 2014-03-12 – 2014-03-14 (×3): 30 mg via SUBCUTANEOUS
  Filled 2014-03-12 (×4): qty 0.3

## 2014-03-12 MED ORDER — PHENOL 1.4 % MT LIQD: 1.0000 | OROMUCOSAL | Status: DC | PRN

## 2014-03-12 MED ORDER — ENSURE COMPLETE PO LIQD
237.0000 mL | Freq: Three times a day (TID) | ORAL | Status: DC
  Administered 2014-03-12 – 2014-03-14 (×6): 237 mL via ORAL

## 2014-03-12 MED ORDER — TRAMADOL HCL 50 MG PO TABS
50.0000 mg | ORAL_TABLET | Freq: Four times a day (QID) | ORAL | Status: DC | PRN
  Administered 2014-03-12: 50 mg via ORAL
  Filled 2014-03-12: qty 1

## 2014-03-12 MED ORDER — CHLORHEXIDINE GLUCONATE CLOTH 2 % EX PADS
6.0000 | MEDICATED_PAD | Freq: Every day | CUTANEOUS | Status: DC
Start: 2014-03-12 — End: 2014-03-14
  Administered 2014-03-13 – 2014-03-14 (×2): 6 via TOPICAL

## 2014-03-12 MED ORDER — METOCLOPRAMIDE HCL 5 MG PO TABS: 5.0000 mg | ORAL_TABLET | Freq: Three times a day (TID) | ORAL | Status: DC | PRN

## 2014-03-12 MED ORDER — CEFAZOLIN SODIUM-DEXTROSE 2-3 GM-% IV SOLR
2.0000 g | Freq: Four times a day (QID) | INTRAVENOUS | Status: AC
  Administered 2014-03-12 (×2): 2 g via INTRAVENOUS
  Filled 2014-03-12 (×2): qty 50

## 2014-03-12 MED ORDER — ENSURE PUDDING PO PUDG
1.0000 | ORAL | Status: DC
  Administered 2014-03-13 – 2014-03-14 (×2): 1 via ORAL

## 2014-03-12 MED ORDER — ADULT MULTIVITAMIN W/MINERALS CH
1.0000 | ORAL_TABLET | Freq: Every day | ORAL | Status: DC
  Administered 2014-03-13 – 2014-03-14 (×2): 1 via ORAL
  Filled 2014-03-12 (×3): qty 1

## 2014-03-12 MED ORDER — ACETAMINOPHEN 325 MG PO TABS
650.0000 mg | ORAL_TABLET | Freq: Four times a day (QID) | ORAL | Status: DC | PRN
  Administered 2014-03-13 – 2014-03-14 (×4): 650 mg via ORAL
  Filled 2014-03-12 (×4): qty 2

## 2014-03-12 MED ORDER — ACETAMINOPHEN 650 MG RE SUPP: 650.0000 mg | Freq: Four times a day (QID) | RECTAL | Status: DC | PRN

## 2014-03-12 NOTE — Op Note (Signed)
NAME:  DEEM, MARMOL NO.:  [id number]  MEDICAL RECORD NO.:  [id number]  LOCATION:  6N17C                        FACILITY:  [facility]  PHYSICIAN:  Susa Day, M.D.    DATE OF BIRTH:  1919/03/03  DATE OF PROCEDURE:  03/11/2014 DATE OF DISCHARGE:                              OPERATIVE REPORT   PREOPERATIVE DIAGNOSIS:  Left intertrochanteric hip fracture.  POSTOPERATIVE DIAGNOSIS:  Left intertrochanteric hip fracture.  PROCEDURE PERFORMED:  Open reduction, intramedullary nailing of left intertrochanteric hip fracture.  ANESTHESIA:  General.  ASSISTANT:  None.  HISTORY:  A 80 year old fellow who sustained a fracture and indicated for stabilization.  Risk and benefits were discussed including bleeding, infection, damage to neurovascular structures, nonunion, need for revision, DVT, PE, anesthetic complications, etc.  TECHNIQUE:  With the patient in supine position, after induction of adequate general anesthesia, 2 g Kefzol, 600 clindamycin, placed supine on the fracture table, well leg, gently flexed and externally rotated. Left lower extremity in longitudinal traction, internal rotation.  Foley to gravity.  Left hip peritrochanteric region prepped and draped in usual sterile fashion.  An incision was made proximal to greater trochanter through the skin, subcutaneous tissue was dissected. Electrocautery was utilized to achieve hemostasis.  The fascia lata, divided in line with skin incision.  The tip of the trochanter was identified.  A guide pin advanced into the femoral canal, was comminuted proximally, slightly over reamed, inserted an 11 mm diameter short intramedullary nail Biomet in the appropriate position with the external alignment guide, made a small incision over the thigh, inserted a guide pin up into the femoral head and neck and the AP and lateral plane satisfactory, well placed parallel guide to that for derotation screw. We drilled the lag  screw at 100.  We drilled the derotation screw to a 75, inserted a 75 mm screw for the derotation complement and 100 mm lag screw with excellent purchase.  This was then compressed.  Traction was released prior to compression.  It was then locked proximally.  In the AP and lateral plane, it was satisfactory near anatomic reduction of the fracture.  We then placed a static distal locking screw with the external alignment guide, small incision laterally, drilled transversely, inserted a 34 mm bicortical screw with excellent purchase. The AP and lateral plane both satisfactory.  I irrigated all wounds, closed the deep fascia with 0 Vicryl, subcu with 2-0, and skin with staples.  Wound was dressed sterilely.  Released from the fracture table.  Leg lengths were equivalent, good pulses, good rotation, extubated without difficulty, and transported to the recovery room in satisfactory condition.  The patient tolerated the procedure well.  No complications, 902 mL of blood loss.     Susa Day, M.D.     Geralynn Rile  D:  03/11/2014  T:  03/12/2014  Job:  [id number]

## 2014-03-12 NOTE — Progress Notes (Signed)
Assessment/Plan: Principal Problem:   Closed intertrochanteric fracture of left hip - s/p repair Active Problems:   CAD (coronary artery disease)   Severe dementia - she is likely to develop some delirium. No problems yet.    Hypertension - BP is fine.    Pernicious anemia  I spoke with son about SNF-rehab process with eye toward ALF return. He understands.    Subjective: Calm and resting overnight. She would barely open eyes this morning.   Objective:  Vital Signs: Filed Vitals:   03/12/14 0305 03/12/14 0355 03/12/14 0400 03/12/14 0500  BP: 110/52 113/50  114/51  Pulse: 70 79  76  Temp: 98.7 F (37.1 C) 97.7 F (36.5 C)  98.4 F (36.9 C)  TempSrc: Axillary Axillary  Axillary  Resp: 16 20 16 16   Weight:      SpO2: 100% 100% 98% 100%     EXAM: resting quietly.    Intake/Output Summary (Last 24 hours) at 03/12/14 0700 Last data filed at 03/12/14 0500  Gross per 24 hour  Intake    850 ml  Output    370 ml  Net    480 ml    Lab Results:  Recent Labs  03/11/14 1435  NA 138  K 3.6*  CL 99  GLUCOSE 120*  BUN 14  CREATININE 0.90   No results found for this basename: AST, ALT, ALKPHOS, BILITOT, PROT, ALBUMIN,  in the last 72 hours No results found for this basename: LIPASE, AMYLASE,  in the last 72 hours  Recent Labs  03/11/14 2017 03/12/14 0441  WBC 13.4* 12.6*  HGB 10.5* 10.3*  HCT 31.9* 31.3*  MCV 89.1 91.3  PLT 270 229   No results found for this basename: CKTOTAL, CKMB, CKMBINDEX, TROPONINI,  in the last 72 hours BNP No results found for this basename: probnp   No results found for this basename: DDIMER,  in the last 72 hours No results found for this basename: HGBA1C,  in the last 72 hours No results found for this basename: CHOL, HDL, LDLCALC, TRIG, CHOLHDL, LDLDIRECT,  in the last 72 hours No results found for this basename: TSH, T4TOTAL, FREET3, T3FREE, THYROIDAB,  in the last 72 hours No results found for this basename: VITAMINB12, FOLATE,  FERRITIN, TIBC, IRON, RETICCTPCT,  in the last 72 hours  Studies/Results: Dg Chest 1 View  03/11/2014   CLINICAL DATA:  Fall.  Hip pain.  EXAM: CHEST - 1 VIEW  COMPARISON:  06/14/2011 chest radiograph.  FINDINGS: Low volume chest. Low volumes accentuate the cardiopericardial silhouette. Mediastinal contours appear within normal limits for volumes of inspiration. Postoperative changes of CABG. Prominent skin fold is present along the RIGHT lateral RIGHT chest. No airspace disease or effusion. Nonspecific diffuse interstitial prominence is probably age related and is accentuated by the low volumes.  Severe bilateral glenohumeral osteoarthritis is present with an erosion in the medial LEFT humeral neck. Erosion appears new compared to the prior exam.  IMPRESSION: Low volume chest.  No gross acute cardiopulmonary disease.   Electronically Signed   By: Dereck Ligas M.D.   On: 03/11/2014 16:20   Dg Hip Complete Left  03/11/2014   CLINICAL DATA:  Fall.  LEFT hip pain.  EXAM: LEFT HIP - COMPLETE 2+ VIEW  COMPARISON:  11/12/2010.  FINDINGS: Intertrochanteric LEFT femur fracture is new compared to the prior radiograph. The fracture is comminuted and moderately displaced. The LEFT femoral head remains located. RIGHT total hip arthroplasty is present. Osteopenia of the pelvic  bones. No displaced pelvic ring fracture.  IMPRESSION: New comminuted intertrochanteric LEFT femur fracture.   Electronically Signed   By: Dereck Ligas M.D.   On: 03/11/2014 16:17   Dg Hip Operative Left  03/11/2014   CLINICAL DATA:  ORIF  EXAM: OPERATIVE LEFT HIP  COMPARISON:  03/11/2014  FINDINGS: Intraoperative fluoroscopy obtained for surgical control purposes. Fluoroscopy time is not recorded.  Spot fluoroscopic images obtained of the left hip demonstrate interval internal fixation of previously identified left intertrochanteric fracture. Intra medullary rod with distal locking screw and 2 compression volts are placed. Near-anatomic  alignment and position of the left hip is demonstrated. Vascular calcifications.  IMPRESSION: Intraoperative fluoroscopy obtained for surgical ventral purposes, demonstrating internal fixation of left hip fracture.   Electronically Signed   By: Lucienne Capers M.D.   On: 03/11/2014 23:08   Medications: Medications administered in the last 24 hours reviewed.  Current Medication List reviewed.    LOS: 1 day   [facility] @ [facility] [phone]) 03/12/2014, 7:00 AM

## 2014-03-12 NOTE — Clinical Social Work Placement (Addendum)
Clinical Social Work Department CLINICAL SOCIAL WORK PLACEMENT NOTE 03/12/2014  Patient:  Heather [name], Heather [name]  Account Number:  [id number] Admit date:  03/11/2014  Clinical Social Worker:  Delrae Sawyers  Date/time:  03/12/2014 03:36 PM  Clinical Social Work is seeking post-discharge placement for this patient at the following level of care:   [place]   (*CSW will update this form in Epic as items are completed)   03/12/2014  Patient/family provided with [place] Department of Clinical Social Work's list of facilities offering this level of care within the geographic area requested by the patient (or if unable, by the patient's family).  03/12/2014  Patient/family informed of their freedom to choose among providers that offer the needed level of care, that participate in Medicare, Medicaid or managed care program needed by the patient, have an available bed and are willing to accept the patient.  03/12/2014  Patient/family informed of [facility]' ownership interest in [facility], as well as of the fact that they are under no obligation to receive care at this facility.  PASARR submitted to EDS on 03/12/2014 PASARR number received on   FL2 transmitted to all facilities in geographic area requested by pt/family on  03/12/2014 FL2 transmitted to all facilities within larger geographic area on   Patient informed that his/her managed care company has contracts with or will negotiate with  certain facilities, including the following:     Patient/family informed of bed offers received:  03/13/14 Patient chooses bed at [facility]  Physician recommends and patient chooses bed at    Patient to be transferred to  [facility] on  03/14/14 Patient to be transferred to facility by [facility] Patient and family notified of transfer on 03/14/14 Name of family member notified:  BOYD Muro - SON  The following physician request were entered in Epic:   Additional Comments:  Lubertha Sayres, MSW, [facility] Licensed Clinical Social Worker [phone] and [phone]
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
## 2014-03-12 NOTE — Progress Notes (Signed)
Subjective: 1 Day Post-Op Procedure(s) (LRB): INTRAMEDULLARY (IM) NAIL INTERTROCHANTRIC (Left) Seen in rounds for Dr. Tonita Cong Patient reports pain as mild.  Pt with no current c/o.  Objective: Vital signs in last 24 hours: Temp:  [97.4 F (36.3 C)-98.7 F (37.1 C)] 98.6 F (37 C) (06/24 1317) Pulse Rate:  [66-82] 79 (06/24 1317) Resp:  [16-21] 16 (06/24 1317) BP: (95-156)/(37-75) 120/44 mmHg (06/24 1317) SpO2:  [94 %-100 %] 100 % (06/24 1317) Weight:  [53.162 kg (117 lb 3.2 oz)] 53.162 kg (117 lb 3.2 oz) (06/23 1842)  Intake/Output from previous day: 06/23 0701 - 06/24 0700 In: 850 [I.V.:850] Out: 370 [Urine:270; Blood:100] Intake/Output this shift: Total I/O In: 200 [P.O.:200] Out: 150 [Urine:150]   Recent Labs  03/11/14 1435 03/11/14 2017 03/12/14 0441  HGB 12.6 10.5* 10.3*    Recent Labs  03/11/14 2017 03/12/14 0441  WBC 13.4* 12.6*  RBC 3.58* 3.43*  HCT 31.9* 31.3*  PLT 270 229    Recent Labs  03/11/14 1435 03/12/14 0441  NA 138 137  K 3.6* 3.6*  CL 99 98  CO2  --  19  BUN 14 17  CREATININE 0.90 0.70  GLUCOSE 120* 135*  CALCIUM  --  9.2    Recent Labs  03/11/14 1428  INR 1.14    Neurologically intact ABD soft Neurovascular intact Sensation intact distally Intact pulses distally Dorsiflexion/Plantar flexion intact Incision: dressing C/D/I and scant drainage No cellulitis present Compartment soft no calf pain or sign of DVT  Assessment/Plan: 1 Day Post-Op Procedure(s) (LRB): INTRAMEDULLARY (IM) NAIL INTERTROCHANTRIC (Left) Advance diet Up with therapy D/C IV fluids PWB LLE D/C to SNF when stable per admitting service  Lacie Draft M. 03/12/2014, 6:12 PM

## 2014-03-12 NOTE — Evaluation (Signed)
Physical Therapy Evaluation Patient Details Name: Heather [name] MRN: [id number] DOB: Feb 03, 1919 Today's Date: 03/12/2014   History of Present Illness  Pt is a 80 yo female who has severe dementia who had an unwitness fall at [place] care unit resulting in a L hip fx. Pt now s/p L Hip IM nail 6/23, PWB L LE.  Clinical Impression  Pt with severe dementia limiting any education on transfer technique, benefit of OOB mobility, and participation in there ex. Pt with minimal EOB sitting tolerance due to pain. Recommend ST-SNF upon d/c to maximize functional recovery.    Follow Up Recommendations SNF;Supervision/Assistance - 24 hour    Equipment Recommendations  None recommended by PT    Recommendations for Other Services       Precautions / Restrictions Precautions Precautions: Fall Restrictions Weight Bearing Restrictions: Yes LLE Weight Bearing: Partial weight bearing      Mobility  Bed Mobility Overal bed mobility: +2 for physical assistance;Needs Assistance Bed Mobility: Supine to Sit;Sit to Supine     Supine to sit: Max assist;+2 for physical assistance Sit to supine: Max assist;+2 for physical assistance   General bed mobility comments: pt wit no initiation of transfer, pt with verbal moaning in pain, pt very tense and retropulsion during transfer  Transfers Overall transfer level:  (unsafe at this time)                  Ambulation/Gait Ambulation/Gait assistance:  (unable at this time)              Science writer    Modified Rankin (Stroke Patients Only)       Balance Overall balance assessment: Needs assistance Sitting-balance support: Feet supported;Single extremity supported Sitting balance-Leahy Scale: Zero Sitting balance - Comments: pt retropulsive and R lateral lean                                     Pertinent Vitals/Pain L hip pain but unable to rate    Home Living  Family/patient expects to be discharged to:: Skilled nursing facility                 Additional Comments: [facility], unsure if they have a SNF section    Prior Function Level of Independence: Needs assistance   Gait / Transfers Assistance Needed: per chart pt was amb with RW  ADL's / Homemaking Assistance Needed: per chart RN staff assisted with ADls  Comments: pt with severe dementia and unable to provide PLOF     Hand Dominance        Extremity/Trunk Assessment   Upper Extremity Assessment: Generalized weakness           Lower Extremity Assessment: Generalized weakness (L LE with pain due to surgery)      Cervical / Trunk Assessment: Kyphotic  Communication   Communication:  (garbled speech)  Cognition Arousal/Alertness: Lethargic Behavior During Therapy: Flat affect Overall Cognitive Status: History of cognitive impairments - at baseline                      General Comments      Exercises        Assessment/Plan    PT Assessment Patient needs continued PT services  PT Diagnosis Difficulty walking;Acute pain   PT  Problem List Decreased strength;Decreased activity tolerance;Decreased balance;Pain;Decreased cognition  PT Treatment Interventions DME instruction;Gait training;Functional mobility training;Therapeutic activities;Therapeutic exercise;Balance training   PT Goals (Current goals can be found in the Care Plan section) Acute Rehab PT Goals Patient Stated Goal: did not report PT Goal Formulation: Patient unable to participate in goal setting Time For Goal Achievement: 03/26/14 Potential to Achieve Goals: Fair    Frequency Min 2X/week   Barriers to discharge        Co-evaluation               End of Session   Activity Tolerance: Patient limited by pain Patient left: in bed;with call bell/phone within reach;with bed alarm set Nurse Communication: Mobility status         Time:  0102-7253 PT Time Calculation (min): 18 min   Charges:   PT Evaluation $Initial PT Evaluation Tier I: 1 Procedure PT Treatments $Therapeutic Activity: 8-22 mins   PT G Codes:          Kingsley Callander 03/12/2014, 2:10 PM  Kittie Plater, PT, DPT Pager #: [phone] Office #: [phone]

## 2014-03-12 NOTE — Clinical Social Work Psychosocial (Signed)
Clinical Social Work Department BRIEF PSYCHOSOCIAL ASSESSMENT 03/12/2014  Patient:  Heather [name], Heather [name]     Account Number:  [id number]     Admit date:  03/11/2014  Clinical Social Worker:  Delrae Sawyers  Date/Time:  03/12/2014 03:31 PM  Referred by:  Physician  Date Referred:  03/12/2014 Referred for  SNF Placement   Other Referral:   none.   Interview type:  Family Other interview type:   CSW spoke with pt's son, Riley Papin [phone]).    PSYCHOSOCIAL DATA Living Status:  FACILITY Admitted from facility:  [facility] Level of care:  Long Term Acute Primary support name:  Jurline Folger Primary support relationship to patient:  CHILD, ADULT Degree of support available:   Strong support system.    CURRENT CONCERNS Current Concerns  Post-Acute Placement   Other Concerns:   none.    SOCIAL WORK ASSESSMENT / PLAN CSW received consult for possible SNF placement at time of discharge. CSW contacted pt's current residency [facility]) regarding pt returning with rehabilitation. Per admissions liaison with [facility], they are unable to provide pt's needs at this time.    CSW spoke with pt's son, Luciana Axe, regarding discharge disposition. Pt's son verbalized understanding of pt's current residency unable to provide SNF level care. Pt's son stated he is agreeable to SNF placement at time of discharge.    CSW to continue to follow and assist with discharge planning needs.   Assessment/plan status:  Psychosocial Support/Ongoing Assessment of Needs Other assessment/ plan:   none.   Information/referral to community resources:   [facility] bed offers.    PATIENT'S/FAMILY'S RESPONSE TO PLAN OF CARE: Pt's son understanding and agreeable to CSW plan of care. Pt's son expressed no further questions or concerns at this time.       Lubertha Sayres, MSW, [facility] Licensed Clinical Social Worker [phone] and [phone]

## 2014-03-13 ENCOUNTER — Encounter (HOSPITAL_COMMUNITY): Payer: Self-pay | Admitting: Specialist

## 2014-03-13 LAB — CBC
HEMATOCRIT: 25.1 % — AB (ref 36.0–46.0)
Hemoglobin: 8.3 g/dL — ABNORMAL LOW (ref 12.0–15.0)
MCH: 29.3 pg (ref 26.0–34.0)
MCHC: 33.1 g/dL (ref 30.0–36.0)
MCV: 88.7 fL (ref 78.0–100.0)
Platelets: 239 10*3/uL (ref 150–400)
RBC: 2.83 MIL/uL — ABNORMAL LOW (ref 3.87–5.11)
RDW: 14.6 % (ref 11.5–15.5)
WBC: 10.7 10*3/uL — ABNORMAL HIGH (ref 4.0–10.5)

## 2014-03-13 LAB — BASIC METABOLIC PANEL
BUN: 13 mg/dL (ref 6–23)
CHLORIDE: 96 meq/L (ref 96–112)
CO2: 25 mEq/L (ref 19–32)
CREATININE: 0.68 mg/dL (ref 0.50–1.10)
Calcium: 8.8 mg/dL (ref 8.4–10.5)
GFR calc Af Amer: 85 mL/min — ABNORMAL LOW (ref >90)
GFR, EST NON AFRICAN AMERICAN: 73 mL/min — AB (ref >90)
Glucose, Bld: 116 mg/dL — ABNORMAL HIGH (ref 70–99)
Potassium: 3.8 mEq/L (ref 3.7–5.3)
Sodium: 133 mEq/L — ABNORMAL LOW (ref 137–147)

## 2014-03-13 LAB — URINE CULTURE
Colony Count: NO GROWTH
Culture: NO GROWTH

## 2014-03-13 NOTE — Clinical Social Work Note (Signed)
CSW has provided pt's health insurance with pt's clinical information to complete SNF authorization prior to discharge.  Lubertha Sayres, MSW, [facility] Licensed Clinical Social Worker [phone] and [phone]

## 2014-03-13 NOTE — Progress Notes (Signed)
Assessment/Plan: Principal Problem:   Closed intertrochanteric fracture of left hip - stable post op. I note difficulty in getting her up due to pain. Could consider whether she should take a pain med prior to PT.  Active Problems:   CAD (coronary artery disease)   Severe dementia - no delirium.    Hypertension   Pernicious anemia  I told her family she would probably be ready for d/c to SNF rehab on 6/26.   Subjective: Resting in bed. Asleep. No meaningful communication  Objective:  Vital Signs: Filed Vitals:   03/12/14 0941 03/12/14 1317 03/12/14 2115 03/13/14 0507  BP: 115/50 120/44 120/39 122/46  Pulse: 82 79 93 76  Temp:  98.6 F (37 C) 98.7 F (37.1 C) 98.2 F (36.8 C)  TempSrc:  Axillary Axillary Axillary  Resp: 16 16 18 17   Weight:      SpO2: 100% 100%  96%     EXAM: calm. Resting.    Intake/Output Summary (Last 24 hours) at 03/13/14 0706 Last data filed at 03/13/14 0458  Gross per 24 hour  Intake 984.17 ml  Output    350 ml  Net 634.17 ml    Lab Results:  Recent Labs  03/12/14 0441 03/13/14 0506  NA 137 133*  K 3.6* 3.8  CL 98 96  CO2 19 25  GLUCOSE 135* 116*  BUN 17 13  CREATININE 0.70 0.68  CALCIUM 9.2 8.8   No results found for this basename: AST, ALT, ALKPHOS, BILITOT, PROT, ALBUMIN,  in the last 72 hours No results found for this basename: LIPASE, AMYLASE,  in the last 72 hours  Recent Labs  03/12/14 0441 03/13/14 0506  WBC 12.6* 10.7*  HGB 10.3* 8.3*  HCT 31.3* 25.1*  MCV 91.3 88.7  PLT 229 239   No results found for this basename: CKTOTAL, CKMB, CKMBINDEX, TROPONINI,  in the last 72 hours BNP No results found for this basename: probnp   No results found for this basename: DDIMER,  in the last 72 hours No results found for this basename: HGBA1C,  in the last 72 hours No results found for this basename: CHOL, HDL, LDLCALC, TRIG, CHOLHDL, LDLDIRECT,  in the last 72 hours No results found for this basename: TSH, T4TOTAL, FREET3,  T3FREE, THYROIDAB,  in the last 72 hours No results found for this basename: VITAMINB12, FOLATE, FERRITIN, TIBC, IRON, RETICCTPCT,  in the last 72 hours  Studies/Results: Dg Chest 1 View  03/11/2014   CLINICAL DATA:  Fall.  Hip pain.  EXAM: CHEST - 1 VIEW  COMPARISON:  06/14/2011 chest radiograph.  FINDINGS: Low volume chest. Low volumes accentuate the cardiopericardial silhouette. Mediastinal contours appear within normal limits for volumes of inspiration. Postoperative changes of CABG. Prominent skin fold is present along the RIGHT lateral RIGHT chest. No airspace disease or effusion. Nonspecific diffuse interstitial prominence is probably age related and is accentuated by the low volumes.  Severe bilateral glenohumeral osteoarthritis is present with an erosion in the medial LEFT humeral neck. Erosion appears new compared to the prior exam.  IMPRESSION: Low volume chest.  No gross acute cardiopulmonary disease.   Electronically Signed   By: Dereck Ligas M.D.   On: 03/11/2014 16:20   Dg Hip Complete Left  03/11/2014   CLINICAL DATA:  Fall.  LEFT hip pain.  EXAM: LEFT HIP - COMPLETE 2+ VIEW  COMPARISON:  11/12/2010.  FINDINGS: Intertrochanteric LEFT femur fracture is new compared to the prior radiograph. The fracture is comminuted and moderately  displaced. The LEFT femoral head remains located. RIGHT total hip arthroplasty is present. Osteopenia of the pelvic bones. No displaced pelvic ring fracture.  IMPRESSION: New comminuted intertrochanteric LEFT femur fracture.   Electronically Signed   By: Dereck Ligas M.D.   On: 03/11/2014 16:17   Dg Hip Operative Left  03/11/2014   CLINICAL DATA:  ORIF  EXAM: OPERATIVE LEFT HIP  COMPARISON:  03/11/2014  FINDINGS: Intraoperative fluoroscopy obtained for surgical control purposes. Fluoroscopy time is not recorded.  Spot fluoroscopic images obtained of the left hip demonstrate interval internal fixation of previously identified left intertrochanteric fracture.  Intra medullary rod with distal locking screw and 2 compression volts are placed. Near-anatomic alignment and position of the left hip is demonstrated. Vascular calcifications.  IMPRESSION: Intraoperative fluoroscopy obtained for surgical ventral purposes, demonstrating internal fixation of left hip fracture.   Electronically Signed   By: Lucienne Capers M.D.   On: 03/11/2014 23:08   Medications: Medications administered in the last 24 hours reviewed.  Current Medication List reviewed.    LOS: 2 days   [facility] Internal Medicine @ [facility] [phone]) 03/13/2014, 7:06 AM

## 2014-03-13 NOTE — Evaluation (Signed)
Occupational Therapy Evaluation Patient Details Name: Heather [name] MRN: [id number] DOB: [birth year] Today's Date: 03/13/2014    History of Present Illness Pt is a 80 yo female who has severe dementia who had an unwitness fall at [facility] resulting in a L hip fx. Pt now s/p L Hip IM nail 6/23, PWB L LE.   Clinical Impression   Pt admitted with above. She demonstrates the below listed deficits and will benefit from continued OT to maximize safety and independence with BADLs.  Pt moved to EOB with total A.  She sat EOB x ~13 mins with max A - heavy lean to Rt.  Pt lethargic and falling asleep while seated.  Unable to engage her in ADL activity.  Overall, she requires total A with all BADLs.  Recommend SNF      Follow Up Recommendations  SNF;Supervision/Assistance - 24 hour    Equipment Recommendations  None recommended by OT    Recommendations for Other Services       Precautions / Restrictions Precautions Precautions: Fall Restrictions Weight Bearing Restrictions: Yes LLE Weight Bearing: Partial weight bearing      Mobility Bed Mobility Overal bed mobility: Needs Assistance Bed Mobility: Supine to Sit;Sit to Supine     Supine to sit: Total assist Sit to supine: Total assist   General bed mobility comments: Pt required assist with all aspects  Transfers Overall transfer level:  (unable)                    Balance Overall balance assessment: Needs assistance Sitting-balance support: Feet supported Sitting balance-Leahy Scale: Zero Sitting balance - Comments: Rt. lateral lean.  Required max A to maintain                                    ADL Overall ADL's : Needs assistance/impaired Eating/Feeding: Total assistance;Bed level   Grooming: Wash/dry face;Wash/dry hands;Brushing hair;Total assistance;Sitting   Upper Body Bathing: Total assistance;Sitting   Lower Body Bathing: Total assistance;Bed level   Upper Body  Dressing : Total assistance;Sitting   Lower Body Dressing: Total assistance;Bed level   Toilet Transfer: Total assistance (unable)   Toileting- Clothing Manipulation and Hygiene: Total assistance;Bed level       Functional mobility during ADLs: Total assistance General ADL Comments: Pt unable to engage in ADL activity     Vision                     Perception     Praxis      Pertinent Vitals/Pain Pt sleeping at end of session.  While seated, pt would indicate pain if she shifted onto Lt hip.       Hand Dominance     Extremity/Trunk Assessment Upper Extremity Assessment Upper Extremity Assessment: Difficult to assess due to impaired cognition (Pt with crepitus bil. shoulders)   Lower Extremity Assessment Lower Extremity Assessment: Defer to PT evaluation   Cervical / Trunk Assessment Cervical / Trunk Assessment: Kyphotic   Communication Communication Communication: Expressive difficulties (garbled speech)   Cognition Arousal/Alertness: Lethargic Behavior During Therapy: Flat affect Overall Cognitive Status: History of cognitive impairments - at baseline                     General Comments       Exercises       Shoulder Instructions  Home Living Family/patient expects to be discharged to:: Skilled nursing facility                                 Additional Comments: pt resides at [facility], unsure if they have a SNF section      Prior Functioning/Environment Level of Independence: Needs assistance  Gait / Transfers Assistance Needed: per chart pt was amb with RW ADL's / Homemaking Assistance Needed: per chart RN staff assisted with ADls   Comments: pt with severe dementia and unable to provide PLOF    OT Diagnosis: Generalized weakness;Acute pain;Cognitive deficits   OT Problem List: Decreased strength;Decreased activity tolerance;Impaired balance (sitting and/or standing);Decreased  cognition;Decreased safety awareness;Decreased knowledge of use of DME or AE;Decreased knowledge of precautions;Pain   OT Treatment/Interventions: Self-care/ADL training;DME and/or AE instruction;Therapeutic activities;Cognitive remediation/compensation;Patient/family education;Balance training    OT Goals(Current goals can be found in the care plan section) Acute Rehab OT Goals OT Goal Formulation: Patient unable to participate in goal setting Time For Goal Achievement: 03/20/14 Potential to Achieve Goals: Fair ADL Goals Pt Will Transfer to Toilet: with mod assist;squat pivot transfer;stand pivot transfer  OT Frequency: Min 2X/week   Barriers to D/C: Decreased caregiver support          Co-evaluation              End of Session Nurse Communication: Mobility status  Activity Tolerance: Patient limited by pain;Patient limited by lethargy Patient left: in bed;with call bell/phone within reach;with bed alarm set   Time: 8676-7209 OT Time Calculation (min): 31 min Charges:  OT General Charges $OT Visit: 1 Procedure OT Evaluation $Initial OT Evaluation Tier I: 1 Procedure OT Treatments $Therapeutic Activity: 23-37 mins G-Codes:    Wyolene Weimann M Apr 12, 2014, 12:24 PM

## 2014-03-13 NOTE — Progress Notes (Signed)
Subjective: 2 Days Post-Op Procedure(s) (LRB): INTRAMEDULLARY (IM) NAIL INTERTROCHANTRIC (Left) Pt resting this AM with no c/o of pain. Her son Luciana Axe is in the room this AM. He reports she has c/o pain with repositioning. She has not yet been up with PT.  Objective: Vital signs in last 24 hours: Temp:  [98.2 F (36.8 C)-98.7 F (37.1 C)] 98.2 F (36.8 C) (06/25 0507) Pulse Rate:  [76-93] 76 (06/25 0507) Resp:  [16-18] 17 (06/25 0507) BP: (115-122)/(39-50) 122/46 mmHg (06/25 0507) SpO2:  [96 %-100 %] 96 % (06/25 0507)  Intake/Output from previous day: 06/24 0701 - 06/25 0700 In: 984.2 [P.O.:410; I.V.:574.2] Out: 350 [Urine:350] Intake/Output this shift: Total I/O In: -  Out: 200 [Urine:200]   Recent Labs  03/11/14 1435 03/11/14 2017 03/12/14 0441 03/13/14 0506  HGB 12.6 10.5* 10.3* 8.3*    Recent Labs  03/12/14 0441 03/13/14 0506  WBC 12.6* 10.7*  RBC 3.43* 2.83*  HCT 31.3* 25.1*  PLT 229 239    Recent Labs  03/12/14 0441 03/13/14 0506  NA 137 133*  K 3.6* 3.8  CL 98 96  CO2 19 25  BUN 17 13  CREATININE 0.70 0.68  GLUCOSE 135* 116*  CALCIUM 9.2 8.8    Recent Labs  03/11/14 1428  INR 1.14    Neurologically intact ABD soft Neurovascular intact Sensation intact distally Intact pulses distally Dorsiflexion/Plantar flexion intact Incision: dressing C/D/I and scant drainage No cellulitis present Compartment soft no calf pain or sign of DVT  Assessment/Plan: 2 Days Post-Op Procedure(s) (LRB): INTRAMEDULLARY (IM) NAIL INTERTROCHANTRIC (Left) Advance diet Up with therapy PWB LLE Will discuss with Dr. Tonita Cong Plan D/C to SNF when medically stable per admitting service Follow up with Dr. Tonita Cong 10-14 days post op for staple removal and xrays  BISSELL, JACLYN M. 03/13/2014, 6:47 AM

## 2014-03-14 LAB — CBC
HEMATOCRIT: 24.7 % — AB (ref 36.0–46.0)
HEMOGLOBIN: 8.2 g/dL — AB (ref 12.0–15.0)
MCH: 29.7 pg (ref 26.0–34.0)
MCHC: 33.2 g/dL (ref 30.0–36.0)
MCV: 89.5 fL (ref 78.0–100.0)
Platelets: 226 10*3/uL (ref 150–400)
RBC: 2.76 MIL/uL — ABNORMAL LOW (ref 3.87–5.11)
RDW: 14.8 % (ref 11.5–15.5)
WBC: 9.7 10*3/uL (ref 4.0–10.5)

## 2014-03-14 MED ORDER — BISACODYL 5 MG PO TBEC: 5.0000 mg | DELAYED_RELEASE_TABLET | Freq: Every day | ORAL | Status: AC | PRN

## 2014-03-14 MED ORDER — ENSURE PUDDING PO PUDG: 1.0000 | ORAL | Status: DC

## 2014-03-14 MED ORDER — POLYETHYLENE GLYCOL 3350 17 G PO PACK: 17.0000 g | PACK | Freq: Every day | ORAL | Status: AC

## 2014-03-14 MED ORDER — TRAMADOL HCL 50 MG PO TABS: 50.0000 mg | ORAL_TABLET | Freq: Four times a day (QID) | ORAL | Status: AC | PRN

## 2014-03-14 NOTE — Progress Notes (Signed)
Report called and given to Sam at [facility]. Pt belongings sent with son. All documents sent with pt by ambulatory. Pt stable and ready for transfer.

## 2014-03-14 NOTE — Discharge Summary (Signed)
Physician Discharge Summary  NAME:Heather [name]  HCW:[id number]  DOB: [birth year]   Admit date: 03/11/2014 Discharge date: 03/14/2014  Admitting Diagnosis: hip fracture  Discharge Diagnoses:  Active Hospital Problems   Diagnosis Date Noted  . Closed intertrochanteric fracture of left hip 03/11/2014  . CAD (coronary artery disease) 03/11/2014  . Severe dementia 03/11/2014  . Hypertension 03/11/2014  . Pernicious anemia 03/11/2014    Resolved Hospital Problems   Diagnosis Date Noted Date Resolved  No resolved problems to display.    Things to follow up in the outpatient setting: appt with Dr. Tonita Cong in 10-14 days  Hospital Course: Patient admitted and underwent IM nail procedure. She tolerated things well and remained hemodynamically stable after procedure. Her dementia is severe and her ability to follow directions was limited but some bed mobility was increased. She is d/c'ed to SNF-rehab to try to increase activities to where she can be d/c'ed to lower level of care.   She did not exhibit any delirium while in the hospital.   It is recommended that she be pretreated with acetaminophen or tramadol prior to attempting any PT  Discharge Condition: improved  Consults: orthopedics  Disposition: SNF-rehab  Discharge Instructions   Diet - low sodium heart healthy    Complete by:  As directed      Increase activity slowly    Complete by:  As directed      Partial weight bearing    Complete by:  As directed   % Body Weight:  50  Laterality:  left  Extremity:  Lower            Medication List         acetaminophen 500 MG tablet  Commonly known as:  TYLENOL  Take 1,000 mg by mouth every 6 (six) hours as needed for moderate pain.     bisacodyl 5 MG EC tablet  Commonly known as:  DULCOLAX  Take 1 tablet (5 mg total) by mouth daily as needed for moderate constipation.     CALCIUM 600+D 600-200 MG-UNIT Tabs  Generic drug:  Calcium Carbonate-Vitamin D  Take 1 tablet by  mouth 2 (two) times daily.     docusate sodium 100 MG capsule  Commonly known as:  COLACE  Take 100 mg by mouth.     enoxaparin 300 MG/3ML Soln injection  Commonly known as:  LOVENOX  Inject 0.3 mLs (30 mg total) into the skin daily.     feeding supplement (ENSURE) Pudg  Take 1 Container by mouth daily.     hydrochlorothiazide 25 MG tablet  Commonly known as:  HYDRODIURIL  Take 25 mg by mouth daily.     levothyroxine 25 MCG tablet  Commonly known as:  SYNTHROID, LEVOTHROID  Take 25 mcg by mouth daily before breakfast.     metoprolol tartrate 25 MG tablet  Commonly known as:  LOPRESSOR  Take 12.5 mg by mouth 2 (two) times daily.     polyethylene glycol packet  Commonly known as:  MIRALAX / GLYCOLAX  Take 17 g by mouth daily.     traMADol 50 MG tablet  Commonly known as:  ULTRAM  Take 1 tablet (50 mg total) by mouth 3 (three) times daily as needed for moderate pain.     traMADol 50 MG tablet  Commonly known as:  ULTRAM  Take 1 tablet (50 mg total) by mouth every 6 (six) hours as needed for moderate pain.     vitamin B-12 1000 MCG tablet  Commonly known as:  CYANOCOBALAMIN  Take 1,000 mcg by mouth daily.     vitamin E 400 UNIT capsule  Generic drug:  vitamin E  Take 400 Units by mouth 2 (two) times daily.           Follow-up Information   Follow up with BEANE,JEFFREY C, MD In 2 weeks.   Specialty:  Orthopedic Surgery   Contact information:   [address] [place] [address] [phone]       Time coordinating discharge: 25 minutes including medication reconciliation,  preparation of discharge papers, and discussion with family    Signed: Horton Finer 03/14/2014, 7:49 AM

## 2014-03-14 NOTE — Progress Notes (Addendum)
CSW received insurance auth# [id number]. CSW to assist with dc after 3pm as requested by facility. Pt son aware and agreeable. PTAR arranged, RN to call report. CSW signing off, no additional needs addressed.  Hunt Oris, MSW, [place]

## 2014-03-14 NOTE — Progress Notes (Signed)
CSW spoke with pt son Promise Weldin who is agreeable to placement at [facility]. CSW awaiting to hear back from the facility and insurance authorization.  Hunt Oris, MSW, [place]

## 2014-03-14 NOTE — Progress Notes (Signed)
Subjective: 3 Days Post-Op Procedure(s) (LRB): INTRAMEDULLARY (IM) NAIL INTERTROCHANTRIC (Left) Patient with no c/o pain. Son present in room, he is working on picking a SNF for placement.  Objective: Vital signs in last 24 hours: Temp:  [97.6 F (36.4 C)-98.8 F (37.1 C)] 97.8 F (36.6 C) (06/26 0554) Pulse Rate:  [66-83] 66 (06/26 0554) Resp:  [15-18] 15 (06/26 0554) BP: (99-147)/(35-60) 145/52 mmHg (06/26 0554) SpO2:  [95 %-99 %] 99 % (06/26 0554) Weight:  [56.2 kg (123 lb 14.4 oz)] 56.2 kg (123 lb 14.4 oz) (06/25 1700)  Intake/Output from previous day: 06/25 0701 - 06/26 0700 In: 800 [I.V.:800] Out: 975 [Urine:975] Intake/Output this shift:     Recent Labs  03/11/14 1435 03/11/14 2017 03/12/14 0441 03/13/14 0506 03/14/14 0355  HGB 12.6 10.5* 10.3* 8.3* 8.2*    Recent Labs  03/13/14 0506 03/14/14 0355  WBC 10.7* 9.7  RBC 2.83* 2.76*  HCT 25.1* 24.7*  PLT 239 226    Recent Labs  03/12/14 0441 03/13/14 0506  NA 137 133*  K 3.6* 3.8  CL 98 96  CO2 19 25  BUN 17 13  CREATININE 0.70 0.68  GLUCOSE 135* 116*  CALCIUM 9.2 8.8    Recent Labs  03/11/14 1428  INR 1.14    Neurologically intact ABD soft Neurovascular intact Sensation intact distally Intact pulses distally Dorsiflexion/Plantar flexion intact Incision: dressing C/D/I and scant drainage No cellulitis present Compartment soft no calf pain or sign of DVT  Assessment/Plan: 3 Days Post-Op Procedure(s) (LRB): INTRAMEDULLARY (IM) NAIL INTERTROCHANTRIC (Left) Advance diet Up with therapy PWB LLE Likely D/C today to SNF, per admitting service Lovenox for DVT ppx on D/C, Rx on chart Follow up with Dr. Tonita Cong 10-14 days post-op for staple removal and xrays Will discuss with Dr. Mliss Fritz, [place]. 03/14/2014, 7:11 AM

## 2015-06-24 ENCOUNTER — Emergency Department (HOSPITAL_COMMUNITY)
Admission: EM | Admit: 2015-06-24 | Discharge: 2015-06-24 | Disposition: A | Discharge status: Home / Self Care | Payer: Medicare Other | Attending: Emergency Medicine | Admitting: Emergency Medicine

## 2015-06-24 ENCOUNTER — Emergency Department (HOSPITAL_COMMUNITY): Payer: Medicare Other

## 2015-06-24 ENCOUNTER — Encounter (HOSPITAL_COMMUNITY): Payer: Self-pay | Admitting: *Deleted

## 2015-06-24 DIAGNOSIS — Z79899 Other long term (current) drug therapy: Secondary | ICD-10-CM | POA: Diagnosis not present

## 2015-06-24 DIAGNOSIS — G309 Alzheimer's disease, unspecified: Secondary | ICD-10-CM | POA: Insufficient documentation

## 2015-06-24 DIAGNOSIS — W050XXA Fall from non-moving wheelchair, initial encounter: Secondary | ICD-10-CM | POA: Insufficient documentation

## 2015-06-24 DIAGNOSIS — I10 Essential (primary) hypertension: Secondary | ICD-10-CM | POA: Insufficient documentation

## 2015-06-24 DIAGNOSIS — I2510 Atherosclerotic heart disease of native coronary artery without angina pectoris: Secondary | ICD-10-CM | POA: Insufficient documentation

## 2015-06-24 DIAGNOSIS — Z7982 Long term (current) use of aspirin: Secondary | ICD-10-CM | POA: Insufficient documentation

## 2015-06-24 DIAGNOSIS — K219 Gastro-esophageal reflux disease without esophagitis: Secondary | ICD-10-CM | POA: Diagnosis not present

## 2015-06-24 DIAGNOSIS — W19XXXA Unspecified fall, initial encounter: Secondary | ICD-10-CM

## 2015-06-24 DIAGNOSIS — Y9289 Other specified places as the place of occurrence of the external cause: Secondary | ICD-10-CM | POA: Diagnosis not present

## 2015-06-24 DIAGNOSIS — E039 Hypothyroidism, unspecified: Secondary | ICD-10-CM | POA: Insufficient documentation

## 2015-06-24 DIAGNOSIS — S0990XA Unspecified injury of head, initial encounter: Secondary | ICD-10-CM | POA: Diagnosis present

## 2015-06-24 DIAGNOSIS — Y998 Other external cause status: Secondary | ICD-10-CM | POA: Insufficient documentation

## 2015-06-24 DIAGNOSIS — E785 Hyperlipidemia, unspecified: Secondary | ICD-10-CM | POA: Diagnosis not present

## 2015-06-24 DIAGNOSIS — Y9389 Activity, other specified: Secondary | ICD-10-CM | POA: Insufficient documentation

## 2015-06-24 DIAGNOSIS — S90414A Abrasion, right lesser toe(s), initial encounter: Secondary | ICD-10-CM | POA: Diagnosis not present

## 2015-06-24 DIAGNOSIS — Z8551 Personal history of malignant neoplasm of bladder: Secondary | ICD-10-CM | POA: Diagnosis not present

## 2015-06-24 DIAGNOSIS — F0281 Dementia in other diseases classified elsewhere with behavioral disturbance: Secondary | ICD-10-CM | POA: Diagnosis not present

## 2015-06-24 DIAGNOSIS — Z8781 Personal history of (healed) traumatic fracture: Secondary | ICD-10-CM | POA: Insufficient documentation

## 2015-06-24 NOTE — Discharge Instructions (Signed)
Fall Prevention in the Home   Falls can cause injuries. They can happen to people of all ages. There are many things you can do to make your home safe and to help prevent falls.   WHAT CAN I DO ON THE OUTSIDE OF MY HOME?  · Regularly fix the edges of walkways and driveways and fix any cracks.  · Remove anything that might make you trip as you walk through a door, such as a raised step or threshold.  · Trim any bushes or trees on the path to your home.  · Use bright outdoor lighting.  · Clear any walking paths of anything that might make someone trip, such as rocks or tools.  · Regularly check to see if handrails are loose or broken. Make sure that both sides of any steps have handrails.  · Any raised decks and porches should have guardrails on the edges.  · Have any leaves, snow, or ice cleared regularly.  · Use sand or salt on walking paths during winter.  · Clean up any spills in your garage right away. This includes oil or grease spills.  WHAT CAN I DO IN THE BATHROOM?   · Use night lights.  · Install grab bars by the toilet and in the tub and shower. Do not use towel bars as grab bars.  · Use non-skid mats or decals in the tub or shower.  · If you need to sit down in the shower, use a plastic, non-slip stool.  · Keep the floor dry. Clean up any water that spills on the floor as soon as it happens.  · Remove soap buildup in the tub or shower regularly.  · Attach bath mats securely with double-sided non-slip rug tape.  · Do not have throw rugs and other things on the floor that can make you trip.  WHAT CAN I DO IN THE BEDROOM?  · Use night lights.  · Make sure that you have a light by your bed that is easy to reach.  · Do not use any sheets or blankets that are too big for your bed. They should not hang down onto the floor.  · Have a firm chair that has side arms. You can use this for support while you get dressed.  · Do not have throw rugs and other things on the floor that can make you trip.  WHAT CAN I DO IN  THE KITCHEN?  · Clean up any spills right away.  · Avoid walking on wet floors.  · Keep items that you use a lot in easy-to-reach places.  · If you need to reach something above you, use a strong step stool that has a grab bar.  · Keep electrical cords out of the way.  · Do not use floor polish or wax that makes floors slippery. If you must use wax, use non-skid floor wax.  · Do not have throw rugs and other things on the floor that can make you trip.  WHAT CAN I DO WITH MY STAIRS?  · Do not leave any items on the stairs.  · Make sure that there are handrails on both sides of the stairs and use them. Fix handrails that are broken or loose. Make sure that handrails are as long as the stairways.  · Check any carpeting to make sure that it is firmly attached to the stairs. Fix any carpet that is loose or worn.  · Avoid having throw rugs at the top   or bottom of the stairs. If you do have throw rugs, attach them to the floor with carpet tape.  · Make sure that you have a light switch at the top of the stairs and the bottom of the stairs. If you do not have them, ask someone to add them for you.  WHAT ELSE CAN I DO TO HELP PREVENT FALLS?  · Wear shoes that:    Do not have high heels.    Have rubber bottoms.    Are comfortable and fit you well.    Are closed at the toe. Do not wear sandals.  · If you use a stepladder:    Make sure that it is fully opened. Do not climb a closed stepladder.    Make sure that both sides of the stepladder are locked into place.    Ask someone to hold it for you, if possible.  · Clearly mark and make sure that you can see:    Any grab bars or handrails.    First and last steps.    Where the edge of each step is.  · Use tools that help you move around (mobility aids) if they are needed. These include:    Canes.    Walkers.    Scooters.    Crutches.  · Turn on the lights when you go into a dark area. Replace any light bulbs as soon as they burn out.  · Set up your furniture so you have a clear  path. Avoid moving your furniture around.  · If any of your floors are uneven, fix them.  · If there are any pets around you, be aware of where they are.  · Review your medicines with your doctor. Some medicines can make you feel dizzy. This can increase your chance of falling.  Ask your doctor what other things that you can do to help prevent falls.     This information is not intended to replace advice given to you by your health care provider. Make sure you discuss any questions you have with your health care provider.     Document Released: 07/02/2009 Document Revised: 01/20/2015 Document Reviewed: 10/10/2014  [employer]vier Interactive Patient Education ©2016 [employer]
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
## 2015-07-20 IMAGING — CR DG HIP (WITH OR WITHOUT PELVIS) 2-3V*L*
4 series · 4 of 4 positions shown · non-contrast
Comparison: 11/12/2010.

CLINICAL DATA: Fall.  LEFT hip pain.

EXAM:
LEFT HIP - COMPLETE 2+ VIEW

[t pelvis a.p.]
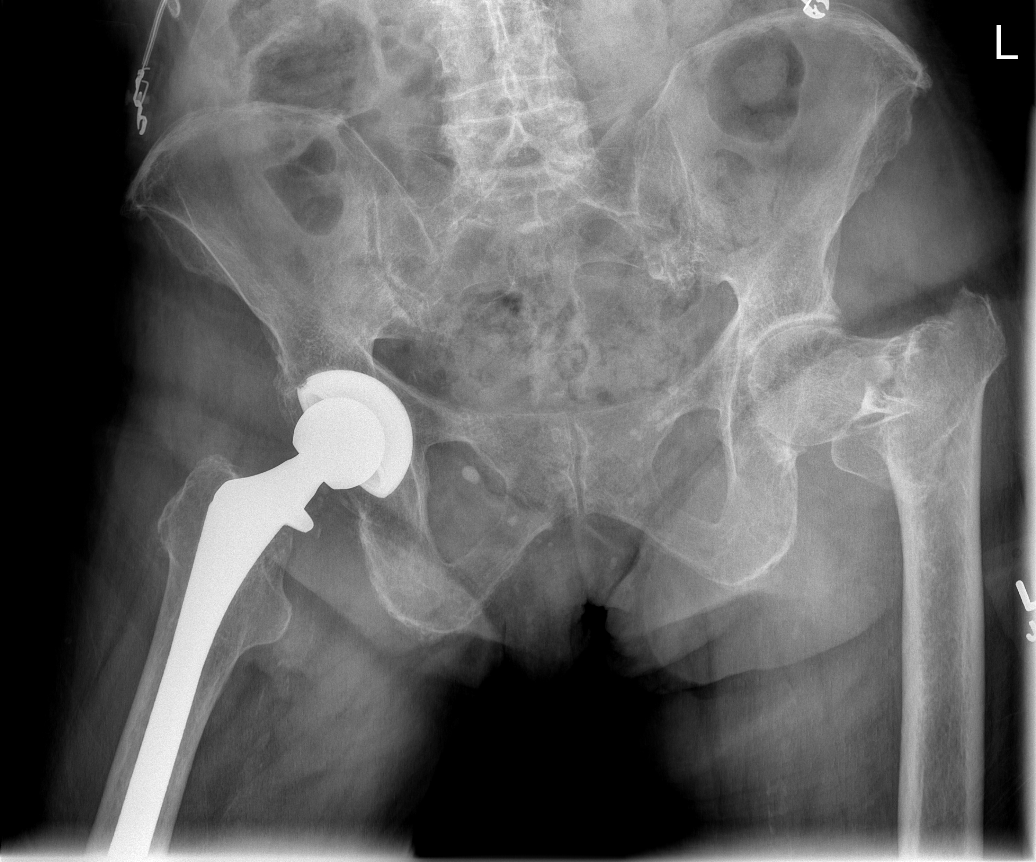

[t hip frog leg left]
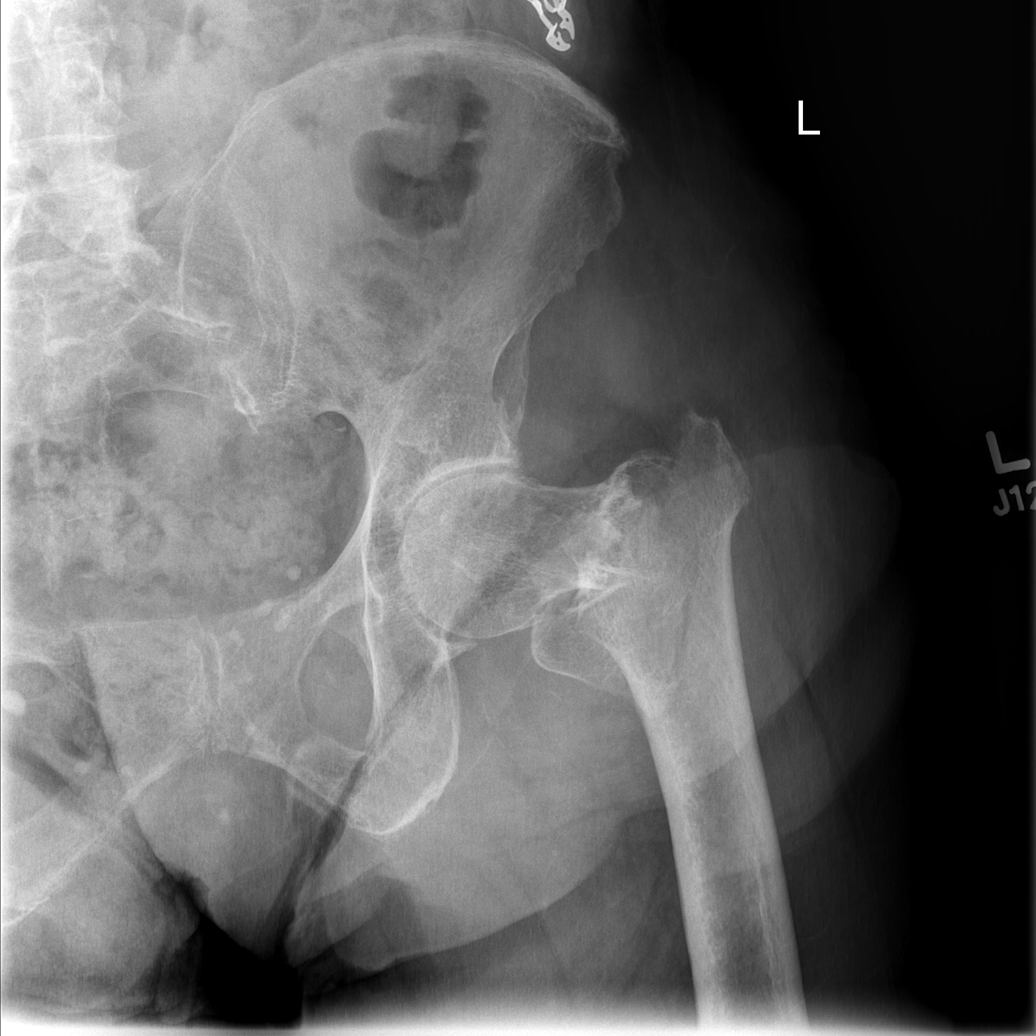

[t hip ap left]
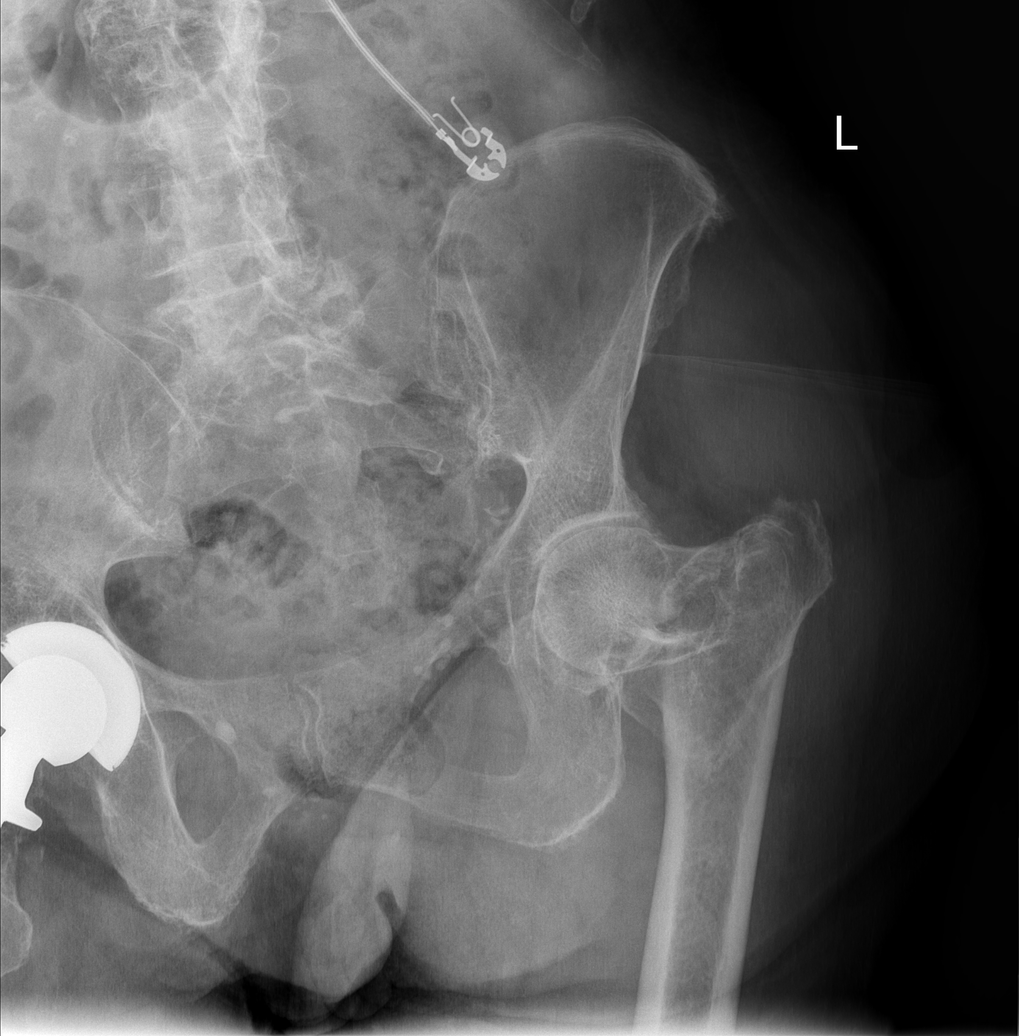

[w hip lat *]
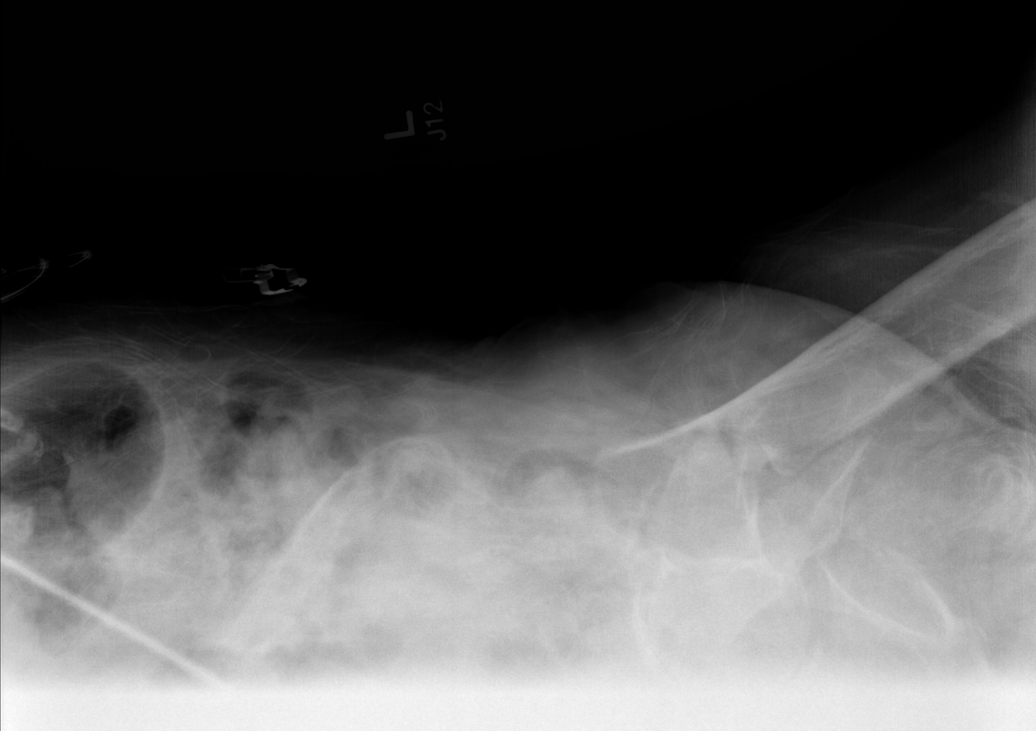

[4 of 4 positions shown; findings below may reference images not displayed]

FINDINGS: Intertrochanteric LEFT femur fracture is new compared to the prior
radiograph. The fracture is comminuted and moderately displaced. The
LEFT femoral head remains located. RIGHT total hip arthroplasty is
present. Osteopenia of the pelvic bones. No displaced pelvic ring
fracture.
IMPRESSION: New comminuted intertrochanteric LEFT femur fracture.

## 2015-07-20 IMAGING — CR DG CHEST 1V
1 series · 1 of 1 positions shown · non-contrast
Comparison: 06/14/2011 chest radiograph.

CLINICAL DATA: Fall.  Hip pain.

EXAM:
CHEST - 1 VIEW

[view not recorded]
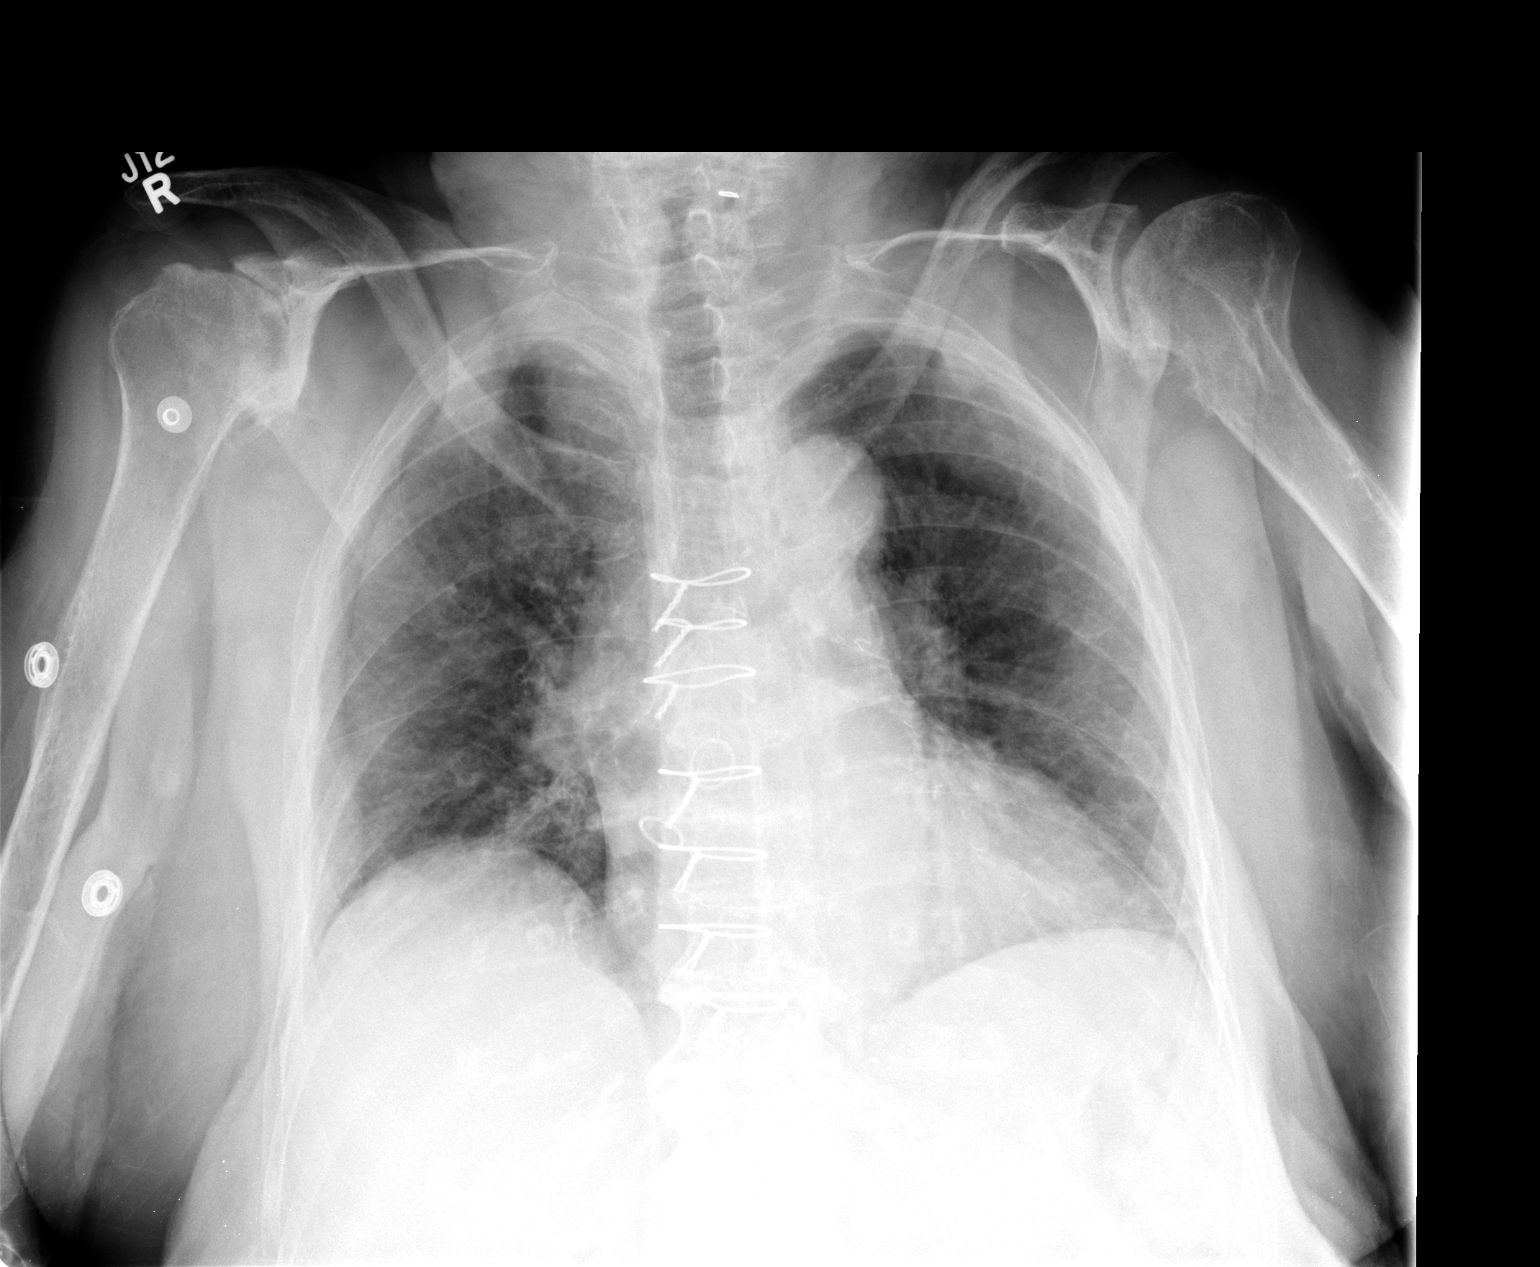

[1 of 1 positions shown; findings below may reference images not displayed]

FINDINGS: Low volume chest. Low volumes accentuate the cardiopericardial
silhouette. Mediastinal contours appear within normal limits for
volumes of inspiration. Postoperative changes of CABG. Prominent
skin fold is present along the RIGHT lateral RIGHT chest. No
airspace disease or effusion. Nonspecific diffuse interstitial
prominence is probably age related and is accentuated by the low
volumes.

Severe bilateral glenohumeral osteoarthritis is present with an
erosion in the medial LEFT humeral neck. Erosion appears new
compared to the prior exam.
IMPRESSION: Low volume chest.  No gross acute cardiopulmonary disease.

## 2015-07-20 IMAGING — RF DG HIP OPERATIVE*L*
1 series · 4 of 4 positions shown · non-contrast
Comparison: 03/11/2014

CLINICAL DATA: ORIF

EXAM:
OPERATIVE LEFT HIP

[Series 1: run · 4 of 4 slices shown]
[im 1/4]
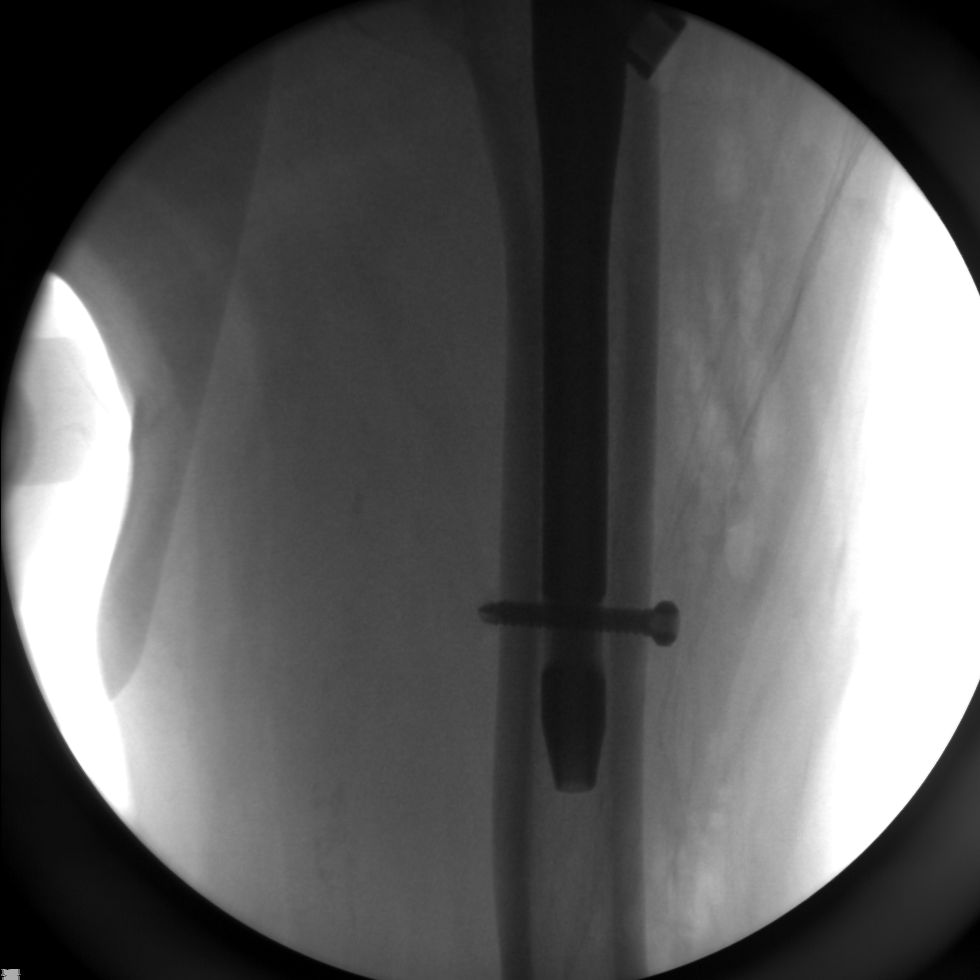
[im 2/4]
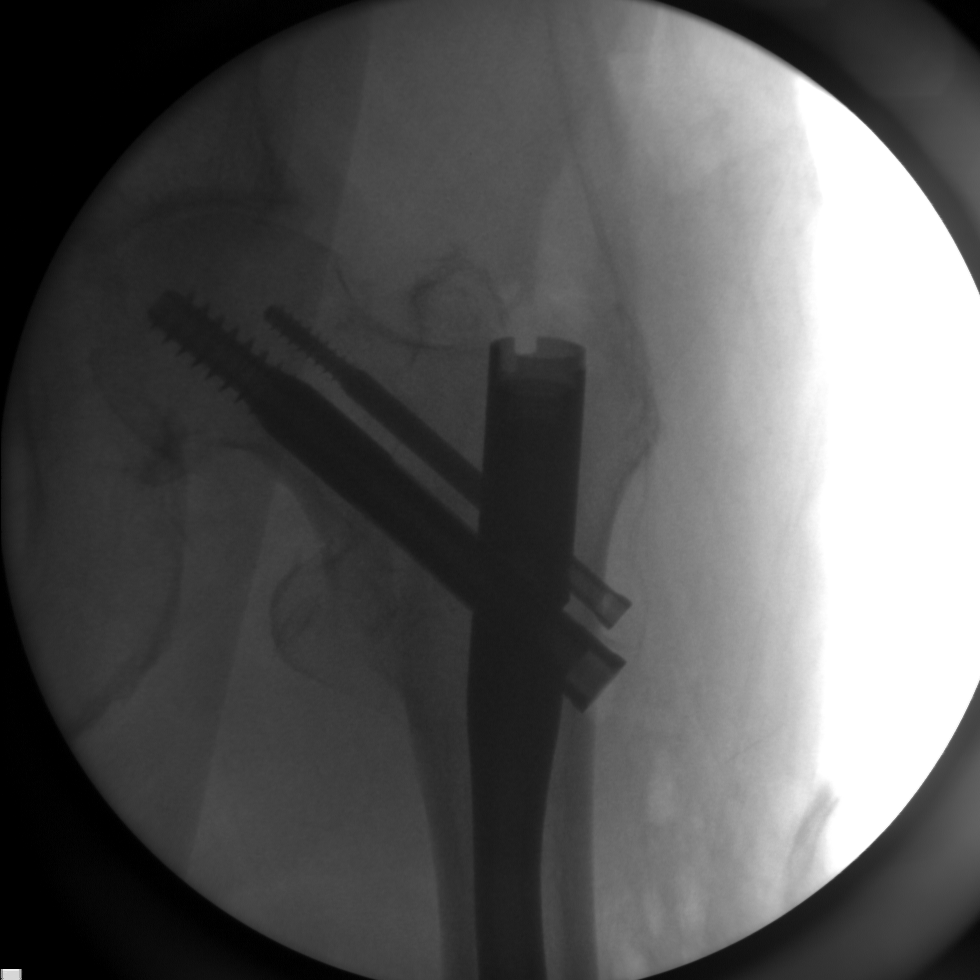
[im 3/4]
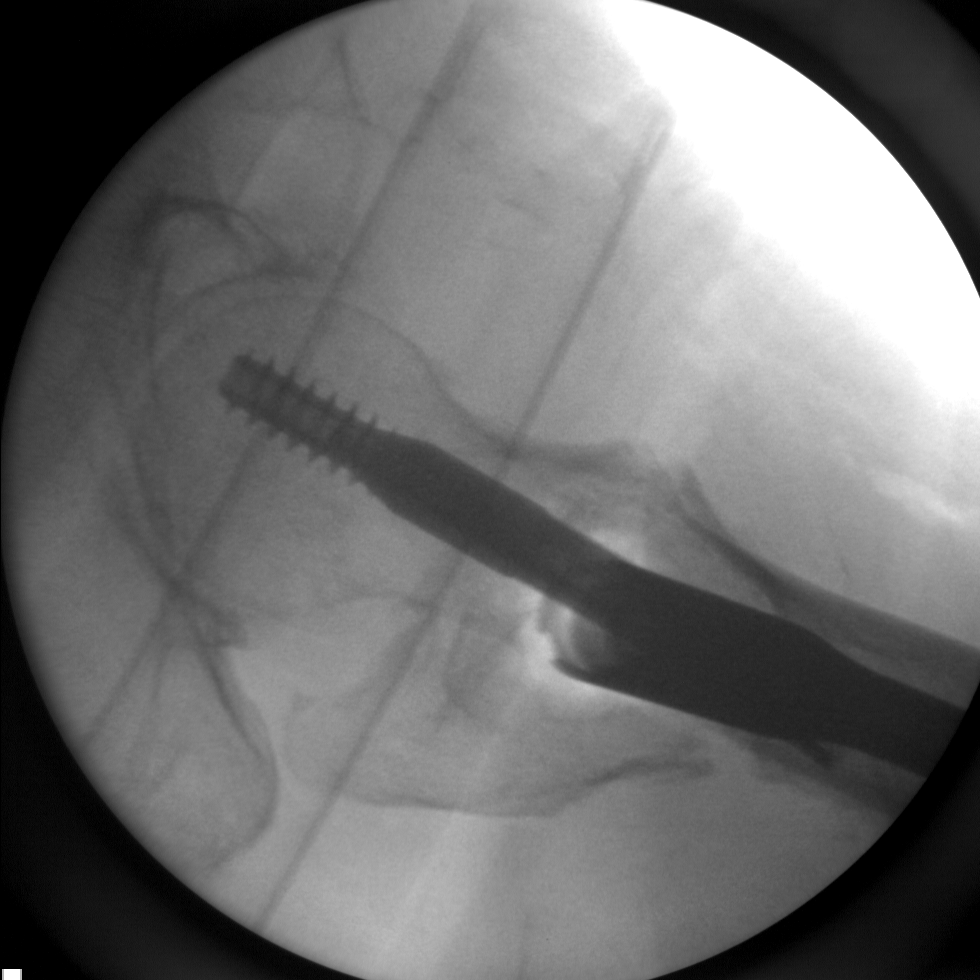
[im 4/4]
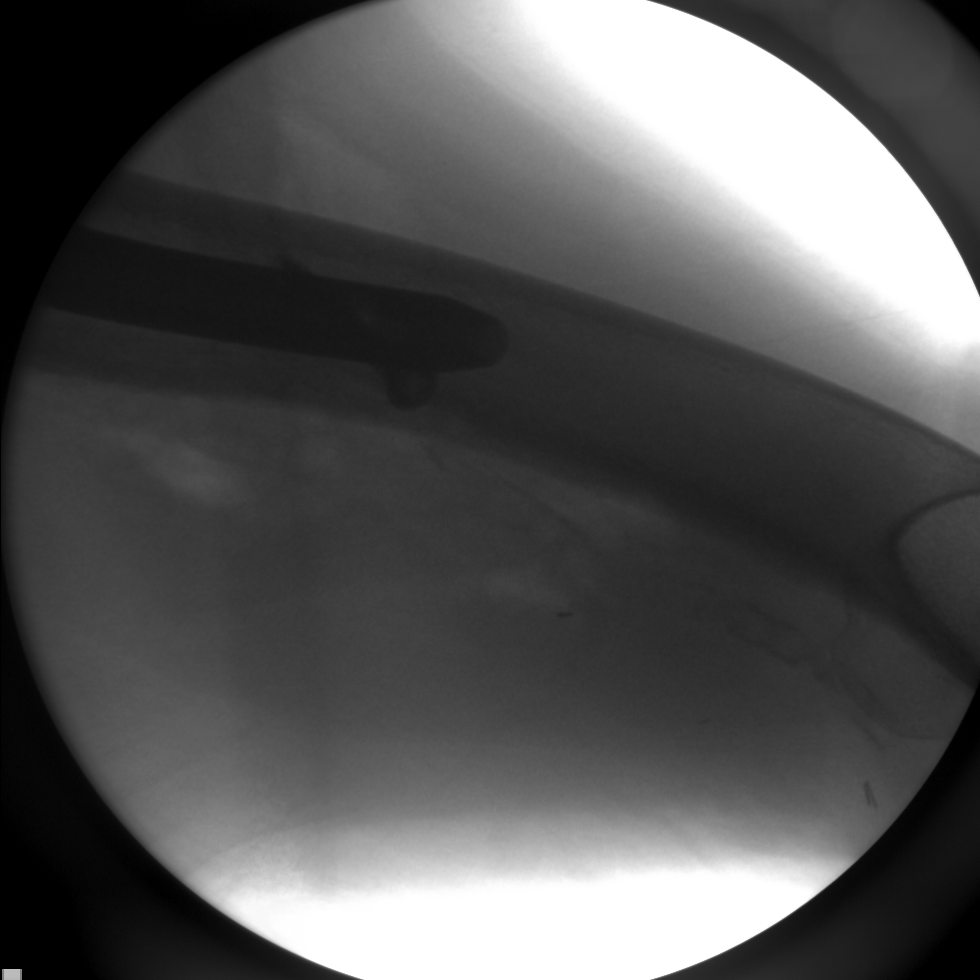

[4 of 4 positions shown; findings below may reference images not displayed]

FINDINGS: Intraoperative fluoroscopy obtained for surgical control purposes.
Fluoroscopy time is not recorded.

Spot fluoroscopic images obtained of the left hip demonstrate
interval internal fixation of previously identified left
intertrochanteric fracture. Intra medullary rod with distal locking
screw and 2 compression volts are placed. Near-anatomic alignment
and position of the left hip is demonstrated. Vascular
calcifications.
IMPRESSION: Intraoperative fluoroscopy obtained for surgical ventral purposes,
demonstrating internal fixation of left hip fracture.

## 2015-11-09 ENCOUNTER — Emergency Department (HOSPITAL_COMMUNITY)
Admission: EM | Admit: 2015-11-09 | Discharge: 2015-11-09 | Disposition: A | Discharge status: Home / Self Care | Payer: Medicare Other | Attending: Emergency Medicine | Admitting: Emergency Medicine

## 2015-11-09 ENCOUNTER — Encounter (HOSPITAL_COMMUNITY): Payer: Self-pay

## 2015-11-09 DIAGNOSIS — Z8781 Personal history of (healed) traumatic fracture: Secondary | ICD-10-CM | POA: Diagnosis not present

## 2015-11-09 DIAGNOSIS — M19042 Primary osteoarthritis, left hand: Secondary | ICD-10-CM | POA: Diagnosis not present

## 2015-11-09 DIAGNOSIS — W1839XA Other fall on same level, initial encounter: Secondary | ICD-10-CM | POA: Diagnosis not present

## 2015-11-09 DIAGNOSIS — Z8551 Personal history of malignant neoplasm of bladder: Secondary | ICD-10-CM | POA: Insufficient documentation

## 2015-11-09 DIAGNOSIS — F0280 Dementia in other diseases classified elsewhere without behavioral disturbance: Secondary | ICD-10-CM | POA: Insufficient documentation

## 2015-11-09 DIAGNOSIS — K219 Gastro-esophageal reflux disease without esophagitis: Secondary | ICD-10-CM | POA: Diagnosis not present

## 2015-11-09 DIAGNOSIS — M19041 Primary osteoarthritis, right hand: Secondary | ICD-10-CM | POA: Insufficient documentation

## 2015-11-09 DIAGNOSIS — Y998 Other external cause status: Secondary | ICD-10-CM | POA: Insufficient documentation

## 2015-11-09 DIAGNOSIS — I2510 Atherosclerotic heart disease of native coronary artery without angina pectoris: Secondary | ICD-10-CM | POA: Diagnosis not present

## 2015-11-09 DIAGNOSIS — Y9389 Activity, other specified: Secondary | ICD-10-CM | POA: Diagnosis not present

## 2015-11-09 DIAGNOSIS — E039 Hypothyroidism, unspecified: Secondary | ICD-10-CM | POA: Insufficient documentation

## 2015-11-09 DIAGNOSIS — Z043 Encounter for examination and observation following other accident: Secondary | ICD-10-CM | POA: Diagnosis present

## 2015-11-09 DIAGNOSIS — I10 Essential (primary) hypertension: Secondary | ICD-10-CM | POA: Diagnosis not present

## 2015-11-09 DIAGNOSIS — Y9289 Other specified places as the place of occurrence of the external cause: Secondary | ICD-10-CM | POA: Insufficient documentation

## 2015-11-09 DIAGNOSIS — Z79899 Other long term (current) drug therapy: Secondary | ICD-10-CM | POA: Insufficient documentation

## 2015-11-09 DIAGNOSIS — Z7982 Long term (current) use of aspirin: Secondary | ICD-10-CM | POA: Diagnosis not present

## 2015-11-09 DIAGNOSIS — K5900 Constipation, unspecified: Secondary | ICD-10-CM | POA: Insufficient documentation

## 2015-11-09 DIAGNOSIS — G309 Alzheimer's disease, unspecified: Secondary | ICD-10-CM | POA: Diagnosis not present

## 2015-11-09 DIAGNOSIS — W19XXXA Unspecified fall, initial encounter: Secondary | ICD-10-CM

## 2015-11-09 NOTE — ED Notes (Signed)
Patient transported by [facility] from [facility] care.  Patient fell in her room - either out of wheelchair or out of bed - approx 1hr ago.  Fall was unwitnessed.  Patient is normally non-verbal but has been speaking to EMS.  No injuries noted - sent for evaluation only.

## 2015-11-09 NOTE — ED Notes (Signed)
MD at bedside. 

## 2015-11-09 NOTE — ED Notes (Signed)
Bed: [facility] Expected date:  Expected time:  Means of arrival:  Comments: EMS- 82yo F, fall/no complaints
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
## 2016-06-19 DEATH — deceased
